# Patient Record
Sex: Male | Born: 1955 | Race: Black or African American | Hispanic: No | Smoking: Former smoker
Health system: Southern US, Community
[De-identification: ages and names within clinical notes are randomized; demographics above are authoritative.]

## PROBLEM LIST (undated history)

## (undated) DIAGNOSIS — I10 Essential (primary) hypertension: Secondary | ICD-10-CM

## (undated) DIAGNOSIS — M1990 Unspecified osteoarthritis, unspecified site: Secondary | ICD-10-CM

## (undated) HISTORY — PX: KNEE ARTHROSCOPY W/ DEBRIDEMENT: SHX1867

---

## 2001-12-23 ENCOUNTER — Emergency Department (HOSPITAL_COMMUNITY): Admission: EM | Admit: 2001-12-23 | Discharge: 2001-12-24 | Payer: Self-pay | Admitting: Emergency Medicine

## 2001-12-24 ENCOUNTER — Encounter: Payer: Self-pay | Admitting: Emergency Medicine

## 2015-10-25 DIAGNOSIS — Z1389 Encounter for screening for other disorder: Secondary | ICD-10-CM | POA: Diagnosis not present

## 2015-10-25 DIAGNOSIS — Z6831 Body mass index (BMI) 31.0-31.9, adult: Secondary | ICD-10-CM | POA: Diagnosis not present

## 2015-10-25 DIAGNOSIS — R7301 Impaired fasting glucose: Secondary | ICD-10-CM | POA: Diagnosis not present

## 2015-10-25 DIAGNOSIS — E6609 Other obesity due to excess calories: Secondary | ICD-10-CM | POA: Diagnosis not present

## 2015-10-25 DIAGNOSIS — R51 Headache: Secondary | ICD-10-CM | POA: Diagnosis not present

## 2016-03-25 ENCOUNTER — Other Ambulatory Visit (HOSPITAL_COMMUNITY): Payer: Self-pay | Admitting: Internal Medicine

## 2016-03-25 DIAGNOSIS — I10 Essential (primary) hypertension: Secondary | ICD-10-CM | POA: Diagnosis not present

## 2016-03-25 DIAGNOSIS — Z6831 Body mass index (BMI) 31.0-31.9, adult: Secondary | ICD-10-CM | POA: Diagnosis not present

## 2016-03-25 DIAGNOSIS — R519 Headache, unspecified: Secondary | ICD-10-CM

## 2016-03-25 DIAGNOSIS — R51 Headache: Principal | ICD-10-CM

## 2016-03-25 DIAGNOSIS — Z1389 Encounter for screening for other disorder: Secondary | ICD-10-CM | POA: Diagnosis not present

## 2016-03-25 DIAGNOSIS — E669 Obesity, unspecified: Secondary | ICD-10-CM | POA: Diagnosis not present

## 2016-03-25 DIAGNOSIS — G43109 Migraine with aura, not intractable, without status migrainosus: Secondary | ICD-10-CM | POA: Diagnosis not present

## 2016-03-26 ENCOUNTER — Ambulatory Visit (HOSPITAL_COMMUNITY)
Admission: RE | Admit: 2016-03-26 | Discharge: 2016-03-26 | Disposition: A | Discharge status: Home / Self Care | Payer: BLUE CROSS/BLUE SHIELD | Source: Ambulatory Visit | Attending: Internal Medicine | Admitting: Internal Medicine

## 2016-03-26 DIAGNOSIS — R51 Headache: Secondary | ICD-10-CM | POA: Insufficient documentation

## 2016-03-26 DIAGNOSIS — R519 Headache, unspecified: Secondary | ICD-10-CM

## 2016-10-14 DIAGNOSIS — Z6830 Body mass index (BMI) 30.0-30.9, adult: Secondary | ICD-10-CM | POA: Diagnosis not present

## 2016-10-14 DIAGNOSIS — Z1389 Encounter for screening for other disorder: Secondary | ICD-10-CM | POA: Diagnosis not present

## 2016-10-14 DIAGNOSIS — Z0000 Encounter for general adult medical examination without abnormal findings: Secondary | ICD-10-CM | POA: Diagnosis not present

## 2016-10-14 DIAGNOSIS — G43109 Migraine with aura, not intractable, without status migrainosus: Secondary | ICD-10-CM | POA: Diagnosis not present

## 2016-10-14 DIAGNOSIS — E6609 Other obesity due to excess calories: Secondary | ICD-10-CM | POA: Diagnosis not present

## 2017-08-04 DIAGNOSIS — M25562 Pain in left knee: Secondary | ICD-10-CM | POA: Diagnosis not present

## 2017-08-04 DIAGNOSIS — E663 Overweight: Secondary | ICD-10-CM | POA: Diagnosis not present

## 2017-08-04 DIAGNOSIS — M6289 Other specified disorders of muscle: Secondary | ICD-10-CM | POA: Diagnosis not present

## 2017-08-04 DIAGNOSIS — Z6829 Body mass index (BMI) 29.0-29.9, adult: Secondary | ICD-10-CM | POA: Diagnosis not present

## 2017-08-04 DIAGNOSIS — Z1389 Encounter for screening for other disorder: Secondary | ICD-10-CM | POA: Diagnosis not present

## 2017-09-10 ENCOUNTER — Ambulatory Visit: Payer: Self-pay | Admitting: Orthopedic Surgery

## 2017-09-10 ENCOUNTER — Encounter: Payer: Self-pay | Admitting: Orthopedic Surgery

## 2017-09-10 NOTE — Progress Notes (Deleted)
  NEW PATIENT OFFICE VISIT   No chief complaint on file.    MEDICAL DECISION SECTION  xrays ordered? ***  My independent reading of xrays: ***   No diagnosis found.   PLAN: ***  No orders of the defined types were placed in this encounter.  Injection? *** MRI/CT/? ***      No chief complaint on file.   HPI  ROS   No past medical history on file.  *** The histories are not reviewed yet. Please review them in the "History" navigator section and refresh this SmartLink.  No family history on file. Social History   Tobacco Use  . Smoking status: Not on file  Substance Use Topics  . Alcohol use: Not on file  . Drug use: Not on file    @  No outpatient medications have been marked as taking for the 09/10/17 encounter (Appointment) with Vickki Hearing, MD.    There were no vitals taken for this visit.  Physical Exam  Ortho Exam    Fuller Canada, MD  09/10/2017 8:26 AM

## 2017-09-20 ENCOUNTER — Ambulatory Visit: Payer: Self-pay | Admitting: Orthopedic Surgery

## 2017-10-01 ENCOUNTER — Ambulatory Visit: Payer: Self-pay | Admitting: Orthopedic Surgery

## 2017-10-01 ENCOUNTER — Encounter: Payer: Self-pay | Admitting: Orthopedic Surgery

## 2017-10-24 IMAGING — CT CT HEAD W/O CM
3 series · 16 of 47 positions shown, 19 images · non-contrast
Comparison: None.

CLINICAL DATA: Vision changes, headache, intermittent for 1 year.

EXAM:
CT HEAD WITHOUT CONTRAST
TECHNIQUE: Contiguous axial images were obtained from the base of the skull
through the vertex without intravenous contrast.

[Series 2: head wo · axial · 0.46mm/px · z∈[+187,+322]mm · 10 of 33 slices shown, 13 images]
[im 3/33  brain]
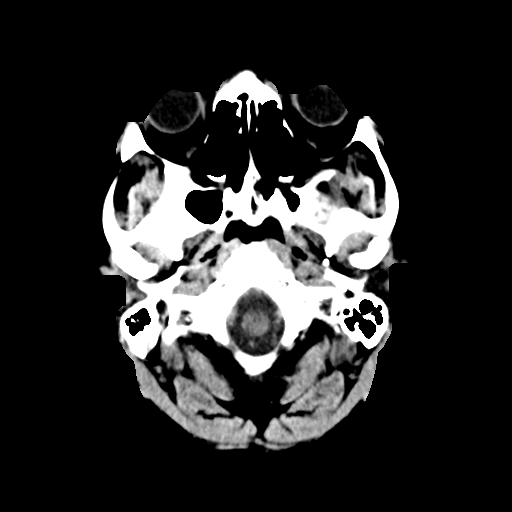
[im 3/33  bone]
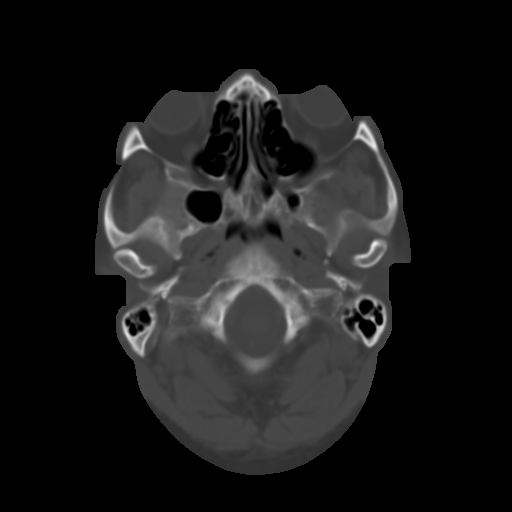
[im 6/33  brain]
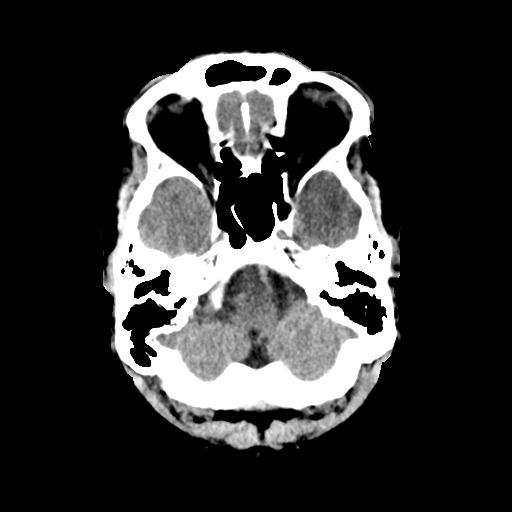
[im 9/33  brain]
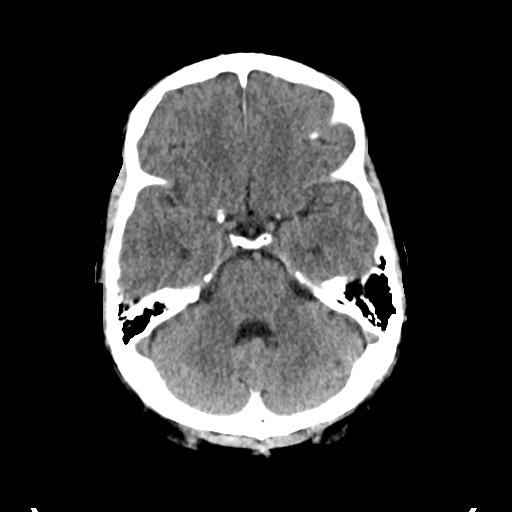
[im 12/33  brain]
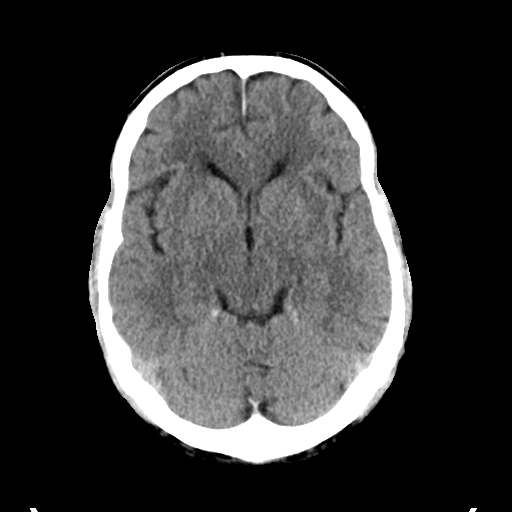
[im 15/33  brain]
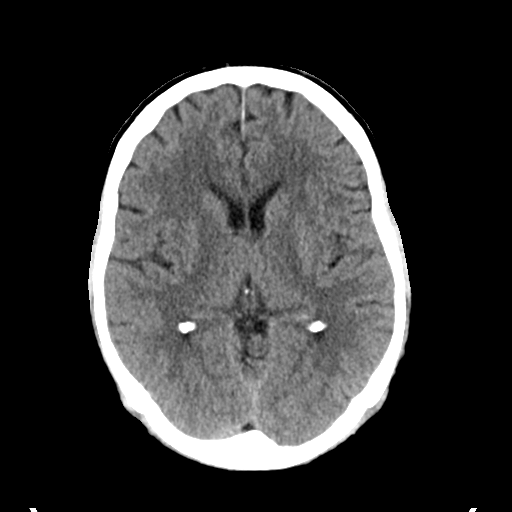
[im 15/33  bone]
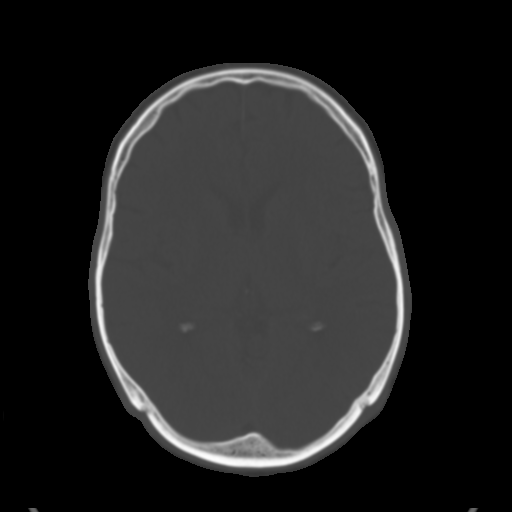
[im 18/33  brain]
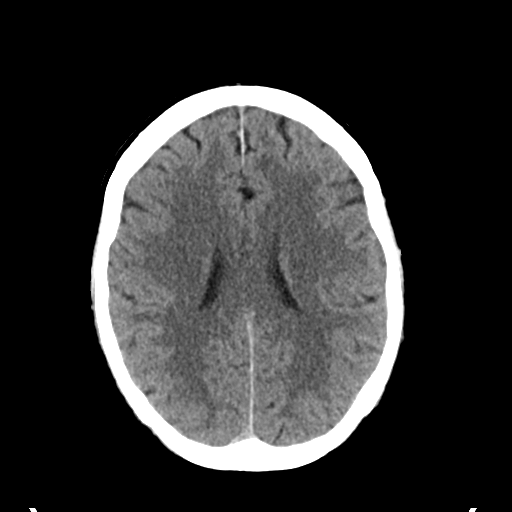
[im 21/33  brain]
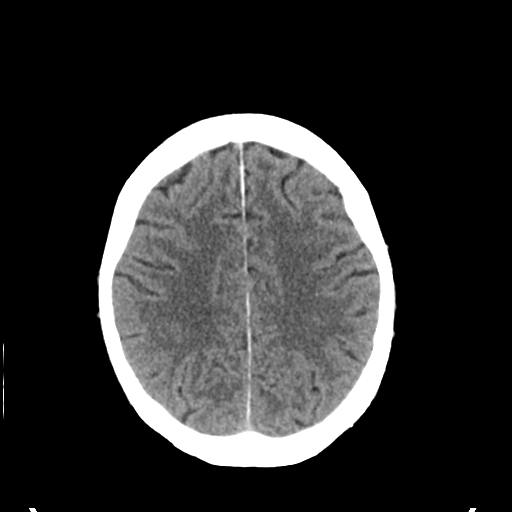
[im 25/33  brain]
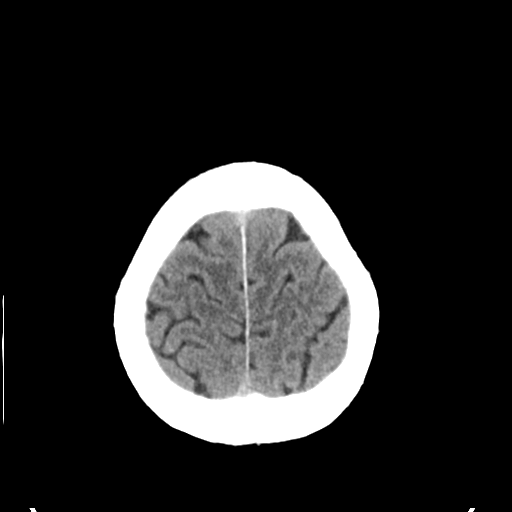
[im 27/33  brain]
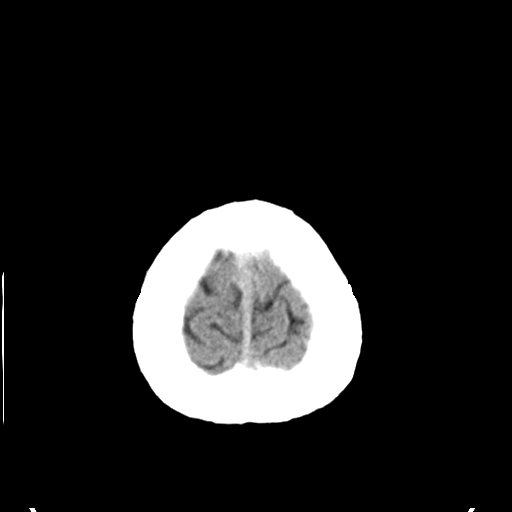
[im 27/33  bone]
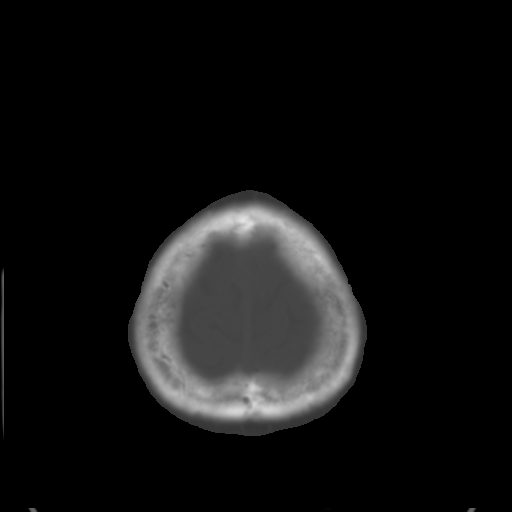
[im 30/33  brain]
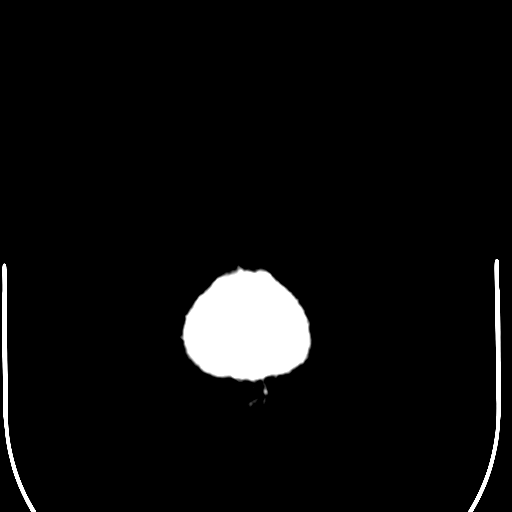

[Series 4: coronal soft tissue · coronal · 0.35mm/px · 3 of 71 slices shown]
[im 24/71  brain]
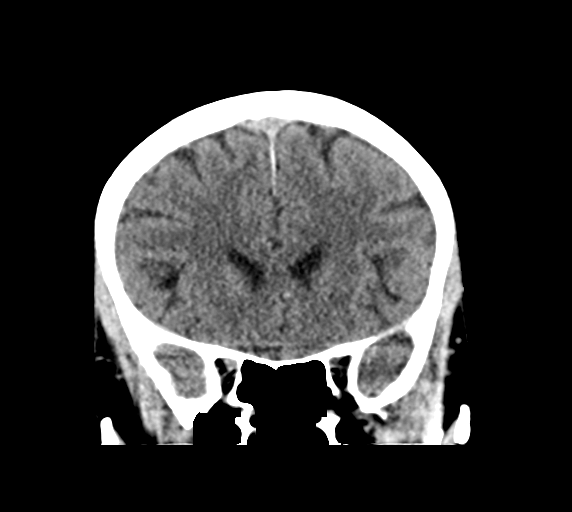
[im 32/71  brain]
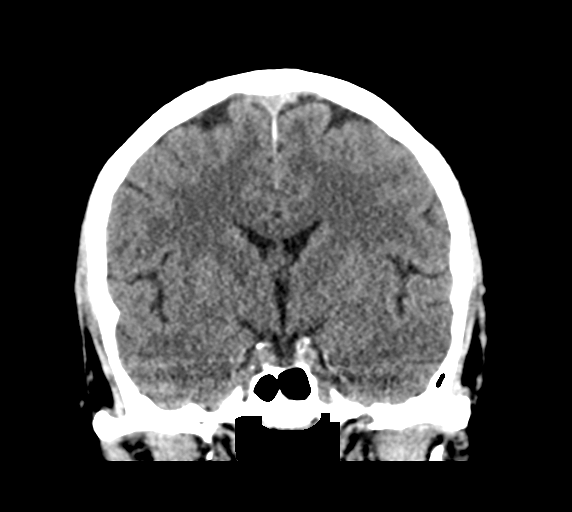
[im 39/71  brain]
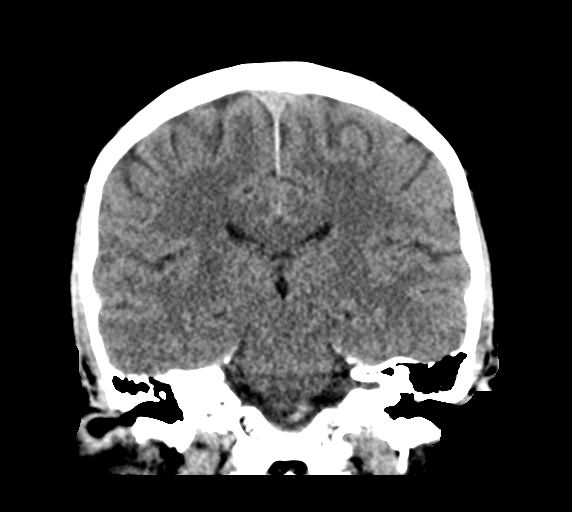

[Series 5: sagittal soft tissue · sagittal · 0.37mm/px · 3 of 67 slices shown]
[im 23/67  brain]
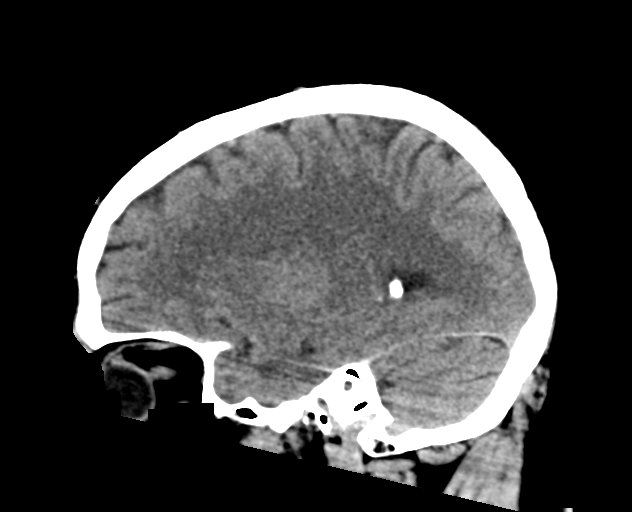
[im 34/67  brain]
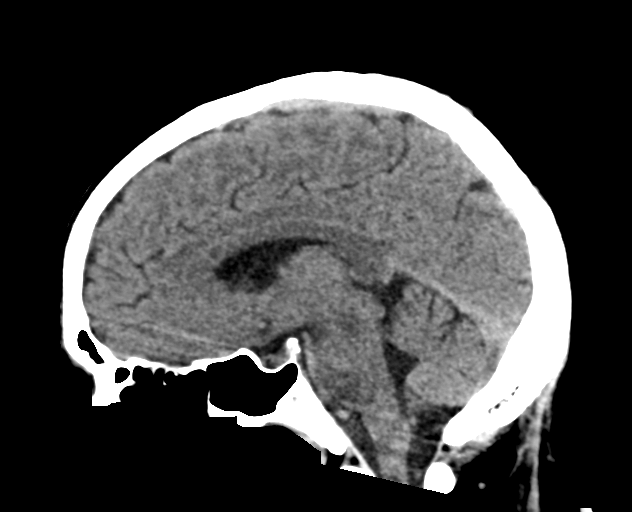
[im 45/67  brain]
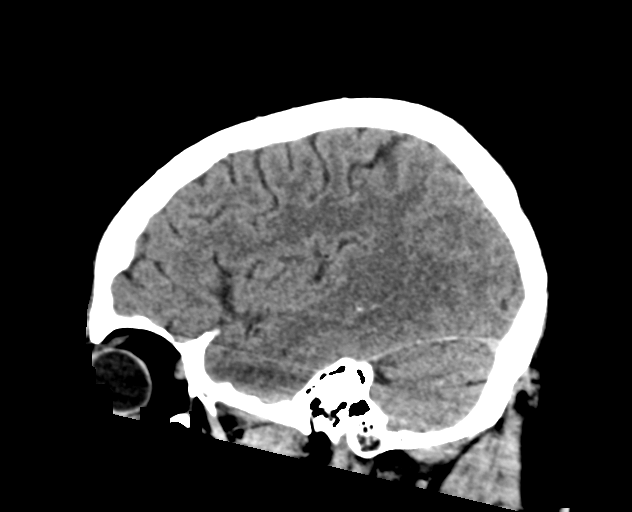

[16 of 47 positions shown; findings below may reference images not displayed]

FINDINGS: Brain: No acute intracranial abnormality. Specifically, no
hemorrhage, hydrocephalus, mass lesion, acute infarction, or
significant intracranial injury.

Vascular: No hyperdense vessel or unexpected calcification.

Skull: No acute calvarial abnormality.

Sinuses/Orbits: Visualized paranasal sinuses and mastoids clear.
Orbital soft tissues unremarkable.

Other: None
IMPRESSION: Normal study.

## 2020-05-20 ENCOUNTER — Ambulatory Visit: Admission: EM | Admit: 2020-05-20 | Discharge: 2020-05-20 | Discharge status: Absent without leave | Payer: Self-pay

## 2020-05-20 ENCOUNTER — Other Ambulatory Visit: Payer: Self-pay

## 2020-05-23 ENCOUNTER — Other Ambulatory Visit: Payer: Self-pay

## 2020-05-23 DIAGNOSIS — Z20822 Contact with and (suspected) exposure to covid-19: Secondary | ICD-10-CM

## 2020-05-24 LAB — NOVEL CORONAVIRUS, NAA: SARS-CoV-2, NAA: NOT DETECTED

## 2020-05-24 LAB — SARS-COV-2, NAA 2 DAY TAT

## 2020-06-10 ENCOUNTER — Encounter: Payer: Self-pay | Admitting: Emergency Medicine

## 2020-06-10 ENCOUNTER — Other Ambulatory Visit: Payer: Self-pay

## 2020-06-10 ENCOUNTER — Ambulatory Visit
Admission: EM | Admit: 2020-06-10 | Discharge: 2020-06-10 | Disposition: A | Discharge status: Home / Self Care | Payer: Self-pay

## 2020-06-10 DIAGNOSIS — J069 Acute upper respiratory infection, unspecified: Secondary | ICD-10-CM

## 2020-06-10 DIAGNOSIS — R519 Headache, unspecified: Secondary | ICD-10-CM

## 2020-06-10 HISTORY — DX: Essential (primary) hypertension: I10

## 2020-06-10 MED ORDER — CETIRIZINE HCL 10 MG PO TABS: 10.0000 mg | ORAL_TABLET | Freq: Every day | ORAL | 0 refills | Status: DC

## 2020-06-10 MED ORDER — BENZONATATE 100 MG PO CAPS: 100.0000 mg | ORAL_CAPSULE | Freq: Three times a day (TID) | ORAL | 0 refills | Status: DC | PRN

## 2020-06-10 MED ORDER — FLUTICASONE PROPIONATE 50 MCG/ACT NA SUSP: 1.0000 | Freq: Every day | NASAL | 0 refills | Status: DC

## 2020-06-10 MED ORDER — PREDNISONE 10 MG PO TABS: 20.0000 mg | ORAL_TABLET | Freq: Every day | ORAL | 0 refills | Status: DC

## 2020-06-10 NOTE — ED Provider Notes (Signed)
[facility]   [id number] 06/10/20 Arrival Time: 1352   CC: URI  SUBJECTIVE: History from: patient.  Tim Grant is a 65 y.o. male who presented to the urgent care for complaint of headache started yesterday, dry cough scratchy throat for the past few weeks.  He also reports left hand a few seconds ago while at the waiting room. He tested negative for Covid 2 weeks ago..  Denies sick exposure to COVID, flu or strep.  Denies recent travel.  Has tried OTC medication without relief.  Denies alleviating aggravating factor.  Denies previous symptoms in the past.   Denies fever, chills, fatigue, sinus pain, rhinorrhea, sore throat, SOB, wheezing, chest pain, nausea, changes in bowel or bladder habits.    ROS: As per HPI.  All other pertinent ROS negative.     Past Medical History:  Diagnosis Date  . Hypertension    History reviewed. No pertinent surgical history. Not on File No current facility-administered medications on file prior to encounter.   Current Outpatient Medications on File Prior to Encounter  Medication Sig Dispense Refill  . losartan (COZAAR) 25 MG tablet Take 25 mg by mouth daily.     Social History   Socioeconomic History  . Marital status: Legally Separated    Spouse name: Not on file  . Number of children: Not on file  . Years of education: Not on file  . Highest education level: Not on file  Occupational History  . Not on file  Tobacco Use  . Smoking status: Former Games developer  . Smokeless tobacco: Never Used  Vaping Use  . Vaping Use: Never used  Substance and Sexual Activity  . Alcohol use: Yes    Comment: every other day  . Drug use: Never  . Sexual activity: Not on file  Other Topics Concern  . Not on file  Social History Narrative  . Not on file   Social Determinants of Health   Financial Resource Strain: Not on file  Food Insecurity: Not on file  Transportation Needs: Not on file  Physical Activity: Not on file  Stress: Not on  file  Social Connections: Not on file  Intimate Partner Violence: Not on file   History reviewed. No pertinent family history.  OBJECTIVE:  Vitals:   06/10/20 1440 06/10/20 1445  BP: (S) (!) 188/115 (!) 169/109  Pulse: 90   Resp: 18   Temp: 98.3 F (36.8 C)   TempSrc: Oral   SpO2: 96%      Physical Exam Vitals and nursing note reviewed.  Constitutional:      General: He is not in acute distress.    Appearance: Normal appearance. He is normal weight. He is not ill-appearing, toxic-appearing or diaphoretic.  HENT:     Right Ear: Ear canal and external ear normal. A middle ear effusion is present. There is no impacted cerumen.     Left Ear: Ear canal and external ear normal. A middle ear effusion is present. There is no impacted cerumen.  Cardiovascular:     Rate and Rhythm: Normal rate and regular rhythm.     Pulses: Normal pulses.     Heart sounds: Normal heart sounds. No murmur heard. No friction rub. No gallop.   Pulmonary:     Effort: Pulmonary effort is normal. No respiratory distress.     Breath sounds: Normal breath sounds. No stridor. No wheezing, rhonchi or rales.  Chest:     Chest wall: No tenderness.  Neurological:  General: No focal deficit present.     Mental Status: He is alert and oriented to person, place, and time.     GCS: GCS eye subscore is 4. GCS verbal subscore is 5. GCS motor subscore is 6.     Cranial Nerves: Cranial nerves are intact.     Sensory: Sensation is intact.     Motor: Motor function is intact.     Coordination: Coordination is intact.     Gait: Gait is intact.      LABS:  No results found for this or any previous visit (from the past 24 hour(s)).   ASSESSMENT & PLAN:  1. Acute nonintractable headache, unspecified headache type   2. Viral URI with cough     Meds ordered this encounter  Medications  . fluticasone (FLONASE) 50 MCG/ACT nasal spray    Sig: Place 1 spray into both nostrils daily for 14 days.    Dispense:   16 g    Refill:  0  . cetirizine (ZYRTEC ALLERGY) 10 MG tablet    Sig: Take 1 tablet (10 mg total) by mouth daily.    Dispense:  30 tablet    Refill:  0  . predniSONE (DELTASONE) 10 MG tablet    Sig: Take 2 tablets (20 mg total) by mouth daily.    Dispense:  15 tablet    Refill:  0  . benzonatate (TESSALON) 100 MG capsule    Sig: Take 1 capsule (100 mg total) by mouth 3 (three) times daily as needed for cough.    Dispense:  30 capsule    Refill:  0   Patient is stable at discharge.  Neuro exam is otherwise normal.  Will prescribe Tessalon Perles, Zyrtec, Flonase and prednisone to help with his symptoms.  He was advised to go to ER if you develop any neurological symptoms.   Discharge instructions  Get plenty of rest and push fluids Tessalon Perles  for cough Zyrtec was prescribed for congestion  flonase for nasal congestion and runny nose Prednisone was prescribed Use medications daily for symptom relief Use OTC medications like ibuprofen or tylenol as needed fever or pain Call or go to the ED if you have any new or worsening symptoms such as fever, worsening cough, shortness of breath, chest tightness, chest pain, turning blue, changes in mental status, confusion, blurry vision, facial droop, tingling and numbness, worsening headache your life etc..  Reviewed expectations re: course of current medical issues. Questions answered. Outlined signs and symptoms indicating need for more acute intervention. Patient verbalized understanding. After Visit Summary given.         Durward Parcel, FNP 06/10/20 1530

## 2020-06-10 NOTE — ED Triage Notes (Signed)
Headache since yesterday, dry cough, scratchy throat.  States while he was waiting, his left hand went numb.

## 2020-06-10 NOTE — ED Triage Notes (Signed)
States he can't focus with his vision right now.

## 2020-06-10 NOTE — Discharge Instructions (Addendum)
Get plenty of rest and push fluids Tessalon Perles  for cough Zyrtec was prescribed for congestion  flonase for nasal congestion and runny nose Prednisone was prescribed Use medications daily for symptom relief Use OTC medications like ibuprofen or tylenol as needed fever or pain Call or go to the ED if you have any new or worsening symptoms such as fever, worsening cough, shortness of breath, chest tightness, chest pain, turning blue, changes in mental status, confusion, blurry vision, facial droop, tingling and numbness, worsening headache your life etc..

## 2021-05-13 ENCOUNTER — Ambulatory Visit (INDEPENDENT_AMBULATORY_CARE_PROVIDER_SITE_OTHER): Payer: 59 | Admitting: Orthopaedic Surgery

## 2021-05-13 ENCOUNTER — Encounter: Payer: Self-pay | Admitting: Orthopaedic Surgery

## 2021-05-13 ENCOUNTER — Ambulatory Visit: Payer: 59

## 2021-05-13 ENCOUNTER — Other Ambulatory Visit: Payer: Self-pay

## 2021-05-13 VITALS — BP 161/105 | HR 90 | Ht 69.0 in | Wt 209.8 lb

## 2021-05-13 DIAGNOSIS — M25552 Pain in left hip: Secondary | ICD-10-CM

## 2021-05-13 DIAGNOSIS — M25562 Pain in left knee: Secondary | ICD-10-CM | POA: Diagnosis not present

## 2021-05-13 DIAGNOSIS — G8929 Other chronic pain: Secondary | ICD-10-CM

## 2021-05-13 MED ORDER — HYDROCODONE-ACETAMINOPHEN 5-325 MG PO TABS
ORAL_TABLET | ORAL | 0 refills | Status: DC
Start: 2021-05-13 — End: 2021-09-10

## 2021-05-13 NOTE — Progress Notes (Signed)
Subjective:    Patient ID: Tim Grant, male    DOB: Apr 27, 1955, 66 y.o.   MRN: [id number]  HPI He has had knee pain on the left for many years, comes and goes.  He had arthroscopy of the left knee in [place] at the old [facility] about 18 to 20 years ago.  He has swelling and popping of the left knee.  He has no giving way.  He has no trauma.  He cannot put his shoes on the left foot as he cannot move his knee and hip well enough to do it.  He has to have shoes that do not need tieing.  He has no trauma to the hip.  He has tried Tylenol, rubs, heat, ice with no help.  He has limp to the left and it bothers his back some.   Review of Systems  Constitutional:  Positive for activity change.  Musculoskeletal:  Positive for arthralgias, gait problem, joint swelling and myalgias.  All other systems reviewed and are negative. For Review of Systems, all other systems reviewed and are negative.  The following is a summary of the past history medically, past history surgically, known current medicines, social history and family history.  This information is gathered electronically by the computer from prior information and documentation.  I review this each visit and have found including this information at this point in the chart is beneficial and informative.   Past Medical History:  Diagnosis Date   Hypertension     No past surgical history on file.  Current Outpatient Medications on File Prior to Visit  Medication Sig Dispense Refill   benzonatate (TESSALON) 100 MG capsule Take 1 capsule (100 mg total) by mouth 3 (three) times daily as needed for cough. 30 capsule 0   cetirizine (ZYRTEC ALLERGY) 10 MG tablet Take 1 tablet (10 mg total) by mouth daily. 30 tablet 0   losartan (COZAAR) 25 MG tablet Take 25 mg by mouth daily.     No current facility-administered medications on file prior to visit.    Social History   Socioeconomic History   Marital status: Legally Separated     Spouse name: Not on file   Number of children: Not on file   Years of education: Not on file   Highest education level: Not on file  Occupational History   Not on file  Tobacco Use   Smoking status: Former   Smokeless tobacco: Never  Vaping Use   Vaping Use: Never used  Substance and Sexual Activity   Alcohol use: Yes    Comment: every other day   Drug use: Never   Sexual activity: Not on file  Other Topics Concern   Not on file  Social History Narrative   Not on file   Social Determinants of Health   Financial Resource Strain: Not on file  Food Insecurity: Not on file  Transportation Needs: Not on file  Physical Activity: Not on file  Stress: Not on file  Social Connections: Not on file  Intimate Partner Violence: Not on file    No family history on file.  BP (!) 161/105    Pulse 90    Ht 5\' 9"  (1.753 m)    Wt 209 lb 12.8 oz (95.2 kg)    BMI 30.98 kg/m   Body mass index is 30.98 kg/m.     Objective:   Physical Exam Vitals and nursing note reviewed. Exam conducted with a chaperone present.  Constitutional:  Appearance: He is well-developed.  HENT:     Head: Normocephalic and atraumatic.  Eyes:     Conjunctiva/sclera: Conjunctivae normal.     Pupils: Pupils are equal, round, and reactive to light.  Cardiovascular:     Rate and Rhythm: Normal rate and regular rhythm.  Pulmonary:     Effort: Pulmonary effort is normal.  Abdominal:     Palpations: Abdomen is soft.  Musculoskeletal:     Cervical back: Normal range of motion and neck supple.       Legs:  Skin:    General: Skin is warm and dry.  Neurological:     Mental Status: He is alert and oriented to person, place, and time.     Cranial Nerves: No cranial nerve deficit.     Motor: No abnormal muscle tone.     Coordination: Coordination normal.     Deep Tendon Reflexes: Reflexes are normal and symmetric. Reflexes normal.  Psychiatric:        Behavior: Behavior normal.        Thought Content:  Thought content normal.        Judgment: Judgment normal.   X-rays were done of the left hip and left knee, reported separately.       Assessment & Plan:   Encounter Diagnoses  Name Primary?   Chronic left hip pain Yes   Chronic pain of left knee    I am concerned about avascular necrosis of the left hip.  I will get MRI.  I will call in pain medicine.  Take one Aleve bid pc  I have reviewed the [place] Controlled Substance Reporting System web site prior to prescribing narcotic medicine for this patient.  Return in two weeks.  Call if any problem.  Precautions discussed.  Electronically Signed Darreld Mclean, MD 1/24/20239:10 AM

## 2021-05-20 ENCOUNTER — Telehealth: Payer: Self-pay | Admitting: Orthopaedic Surgery

## 2021-05-20 NOTE — Telephone Encounter (Signed)
Patient called and wants to know when his MRI is no one has called him back with a date and time.  I gave him the number to the scheduling.  He said no we are suppose to call and schedule it and call him back.   Please call him back at [phone]   We might have to change his appt with Dr. Hilda Lias to review his results.   Thanks

## 2021-05-23 ENCOUNTER — Other Ambulatory Visit: Payer: Self-pay

## 2021-05-23 ENCOUNTER — Encounter: Payer: Self-pay | Admitting: Podiatry

## 2021-05-23 ENCOUNTER — Ambulatory Visit (INDEPENDENT_AMBULATORY_CARE_PROVIDER_SITE_OTHER): Payer: 59 | Admitting: Podiatry

## 2021-05-23 DIAGNOSIS — I739 Peripheral vascular disease, unspecified: Secondary | ICD-10-CM | POA: Diagnosis not present

## 2021-05-23 DIAGNOSIS — B351 Tinea unguium: Secondary | ICD-10-CM | POA: Diagnosis not present

## 2021-05-23 DIAGNOSIS — M79674 Pain in right toe(s): Secondary | ICD-10-CM | POA: Diagnosis not present

## 2021-05-23 DIAGNOSIS — M79675 Pain in left toe(s): Secondary | ICD-10-CM

## 2021-05-23 NOTE — Patient Instructions (Signed)
Terbinafine Tablets °What is this medication? °TERBINAFINE (TER bin a feen) treats fungal infections of the nails. It belongs to a group of medications called antifungals. It will not treat infections caused by bacteria or viruses. °This medicine may be used for other purposes; ask your health care provider or pharmacist if you have questions. °COMMON BRAND NAME(S): Lamisil, Terbinex °What should I tell my care team before I take this medication? °They need to know if you have any of these conditions: °Liver disease °An unusual or allergic reaction to terbinafine, other medications, foods, dyes, or preservatives °Pregnant or trying to get pregnant °Breast-feeding °How should I use this medication? °Take this medication by mouth with water. Take it as directed on the prescription label at the same time every day. You can take it with or without food. If it upsets your stomach, take it with food. Keep taking it unless your care team tells you to stop. °A special MedGuide will be given to you by the pharmacist with each prescription and refill. Be sure to read this information carefully each time. °Talk to your care team regarding the use of this medication in children. Special care may be needed. °Overdosage: If you think you have taken too much of this medicine contact a poison control center or emergency room at once. °NOTE: This medicine is only for you. Do not share this medicine with others. °What if I miss a dose? °If you miss a dose, take it as soon as you can unless it is more than 4 hours late. If it is more than 4 hours late, skip the missed dose. Take the next dose at the normal time. °What may interact with this medication? °Do not take this medication with any of the following: °Pimozide °Thioridazine °This medication may also interact with the following: °Beta blockers °Caffeine °Certain medications for mental health conditions °Cimetidine °Cyclosporine °Medications for fungal infections like fluconazole  and ketoconazole °Medications for irregular heartbeat like amiodarone, flecainide and propafenone °Rifampin °Warfarin °This list may not describe all possible interactions. Give your health care provider a list of all the medicines, herbs, non-prescription drugs, or dietary supplements you use. Also tell them if you smoke, drink alcohol, or use illegal drugs. Some items may interact with your medicine. °What should I watch for while using this medication? °Visit your care team for regular checks on your progress. You may need blood work while you are taking this medication. It may be some time before you see the benefit from this medication. °This medication may cause serious skin reactions. They can happen weeks to months after starting the medication. Contact your care team right away if you notice fevers or flu-like symptoms with a rash. The rash may be red or purple and then turn into blisters or peeling of the skin. Or, you might notice a red rash with swelling of the face, lips or lymph nodes in your neck or under your arms. °This medication can make you more sensitive to the sun. Keep out of the sun, If you cannot avoid being in the sun, wear protective clothing and sunscreen. Do not use sun lamps or tanning beds/booths. °What side effects may I notice from receiving this medication? °Side effects that you should report to your care team as soon as possible: °Allergic reactions--skin rash, itching, hives, swelling of the face, lips, tongue, or throat °Change in sense of smell °Change in taste °Infection--fever, chills, cough, or sore throat °Liver injury--right upper belly pain, loss of appetite, nausea,   light-colored stool, dark yellow or brown urine, yellowing skin or eyes, unusual weakness or fatigue °Low red blood cell level--unusual weakness or fatigue, dizziness, headache, trouble breathing °Lupus-like syndrome--joint pain, swelling, or stiffness, butterfly-shaped rash on the face, rashes that get worse  in the sun, fever, unusual weakness or fatigue °Rash, fever, and swollen lymph nodes °Redness, blistering, peeling, or loosening of the skin, including inside the mouth °Unusual bruising or bleeding °Worsening mood, feelings of depression °Side effects that usually do not require medical attention (report to your care team if they continue or are bothersome): °Diarrhea °Gas °Headache °Nausea °Stomach pain °Upset stomach °This list may not describe all possible side effects. Call your doctor for medical advice about side effects. You may report side effects to FDA at 1-800-FDA-1088. °Where should I keep my medication? °Keep out of the reach of children and pets. °Store between 20 and 25 degrees C (68 and 77 degrees F). Protect from light. Get rid of any unused medication after the expiration date. °To get rid of medications that are no longer needed or have expired: °Take the medication to a medication take-back program. Check with your pharmacy or law enforcement to find a location. °If you cannot return the medication, check the label or package insert to see if the medication should be thrown out in the garbage or flushed down the toilet. If you are not sure, ask your care team. If it is safe to put it in the trash, take the medication out of the container. Mix the medication with cat litter, dirt, coffee grounds, or other unwanted substance. Seal the mixture in a bag or container. Put it in the trash. °NOTE: This sheet is a summary. It may not cover all possible information. If you have questions about this medicine, talk to your doctor, pharmacist, or health care provider. °© 2022 [employer]vier/Gold Standard (2020-11-20 00:00:00) ° °

## 2021-05-27 ENCOUNTER — Ambulatory Visit: Payer: 59 | Admitting: Orthopaedic Surgery

## 2021-05-27 NOTE — Progress Notes (Signed)
Subjective:   Patient ID: Tim Grant, male   DOB: 66 y.o.   MRN: [id number]   HPI 66 year old male presents the office today for concerns of thick, elongated toenails fungus.  He states this for about 2 years ago.  He has tried over-the-counter spray as well as topical Lamisil without any improvement.  Denies any swelling or redness to the toenail sites.  Recently was started on terbinafine which she has not yet picked up the prescription.  He is actually from the pharmacy today working on insurance make sure it is covered, which it is.   Review of Systems  All other systems reviewed and are negative.  Past Medical History:  Diagnosis Date   Hypertension     No past surgical history on file.   Current Outpatient Medications:    benzonatate (TESSALON) 100 MG capsule, Take 1 capsule (100 mg total) by mouth 3 (three) times daily as needed for cough., Disp: 30 capsule, Rfl: 0   cetirizine (ZYRTEC ALLERGY) 10 MG tablet, Take 1 tablet (10 mg total) by mouth daily., Disp: 30 tablet, Rfl: 0   HYDROcodone-acetaminophen (NORCO/VICODIN) 5-325 MG tablet, One tablet every four hours as needed for acute pain.  Limit of five days per [place] statue., Disp: 30 tablet, Rfl: 0   losartan (COZAAR) 25 MG tablet, Take 25 mg by mouth daily., Disp: , Rfl:   Not on File        Objective:  Physical Exam  General: AAO x3, NAD  Dermatological: Nails are significantly hypertrophic, dystrophic with yellow-brown discoloration.  Dark discoloration of multiple toenails.  There is no extension of any hyperpigmentation of the surrounding skin appears to be benign.  He does get discomfort nails 1-5 bilaterally as they are thick and elongated they are not static shoes.  No changes on multiple toenails at this time.  Open lesions.  Vascular: Dorsalis Pedis artery and Posterior Tibial artery pedal pulses are decreased bilateral with immedate capillary fill time.  There is no pain with calf compression,  swelling, warmth, erythema.   Neruologic: Grossly intact via light touch bilateral.  Musculoskeletal: No gross boney pedal deformities bilateral. No pain, crepitus, or limitation noted with foot and ankle range of motion bilateral. Muscular strength 5/5 in all groups tested bilateral.  Gait: Unassisted, Nonantalgic.       Assessment:   Symptomatic onychomycosis, decreased pedal pulses     Plan:  -Treatment options discussed including all alternatives, risks, and complications -Etiology of symptoms were discussed -Sharply debrided the nails x10 without any complications or bleeding.  Discussed treatment options for nail fungus.  He is undergoing to start Lamisil which was prescribed by his primary care provider.  Discussed side effects of medication. -Given decreased pulses and will order ABI.  Return in about 3 months (around 08/20/2021).  Trula Slade DPM

## 2021-05-28 ENCOUNTER — Telehealth: Payer: Self-pay | Admitting: *Deleted

## 2021-06-01 ENCOUNTER — Other Ambulatory Visit: Payer: 59

## 2021-06-05 ENCOUNTER — Ambulatory Visit: Payer: 59 | Admitting: Orthopaedic Surgery

## 2021-06-06 ENCOUNTER — Other Ambulatory Visit: Payer: Self-pay | Admitting: Podiatry

## 2021-06-06 DIAGNOSIS — I739 Peripheral vascular disease, unspecified: Secondary | ICD-10-CM

## 2021-06-06 NOTE — Progress Notes (Signed)
Received notification that the ABI ordered was out of network. I have sent updated referral to [facility] to see if they accept.

## 2021-06-13 ENCOUNTER — Ambulatory Visit (HOSPITAL_COMMUNITY)
Admission: RE | Admit: 2021-06-13 | Discharge: 2021-06-13 | Disposition: A | Discharge status: Home / Self Care | Payer: 59 | Source: Ambulatory Visit | Attending: Orthopaedic Surgery | Admitting: Orthopaedic Surgery

## 2021-06-13 ENCOUNTER — Other Ambulatory Visit: Payer: Self-pay

## 2021-06-13 DIAGNOSIS — G8929 Other chronic pain: Secondary | ICD-10-CM | POA: Insufficient documentation

## 2021-06-13 DIAGNOSIS — M25552 Pain in left hip: Secondary | ICD-10-CM | POA: Diagnosis present

## 2021-06-25 ENCOUNTER — Other Ambulatory Visit (HOSPITAL_COMMUNITY): Payer: Self-pay | Admitting: Podiatry

## 2021-06-25 DIAGNOSIS — I739 Peripheral vascular disease, unspecified: Secondary | ICD-10-CM

## 2021-06-26 ENCOUNTER — Ambulatory Visit (INDEPENDENT_AMBULATORY_CARE_PROVIDER_SITE_OTHER): Payer: 59 | Admitting: Orthopaedic Surgery

## 2021-06-26 ENCOUNTER — Encounter: Payer: Self-pay | Admitting: Orthopaedic Surgery

## 2021-06-26 ENCOUNTER — Other Ambulatory Visit: Payer: Self-pay

## 2021-06-26 DIAGNOSIS — G8929 Other chronic pain: Secondary | ICD-10-CM | POA: Diagnosis not present

## 2021-06-26 DIAGNOSIS — M25552 Pain in left hip: Secondary | ICD-10-CM

## 2021-06-26 NOTE — Progress Notes (Signed)
My hip still hurts. ? ?He had MRI of the left hip.  It showed: ?IMPRESSION: ?1. Severe osteoarthritis of the left hip. ?2. Mild osteoarthritis of the right hip. ?3. 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 ?  ?I have explained the findings to him.  I have recommended he consider total hip on the left.  I will have him see Dr. Ninfa Linden.  He is agreeable to this. ? ?I have independently reviewed the MRI.   ? ?Left hip has marked decreased ROM.  He has slight limp to the left. ?NV intact.  He has no distal edema. ? ?Encounter Diagnosis  ?Name Primary?  ? Chronic left hip pain Yes  ? ?To see Dr. Ninfa Linden. ? ?Call if any problem. ? ?Precautions discussed. ? ?Electronically Signed ?Sanjuana Kava, MD ?3/9/20238:07 AM ? ?

## 2021-06-30 ENCOUNTER — Ambulatory Visit (HOSPITAL_COMMUNITY): Admission: RE | Admit: 2021-06-30 | Payer: 59 | Source: Ambulatory Visit

## 2021-07-01 ENCOUNTER — Ambulatory Visit (HOSPITAL_COMMUNITY)
Admission: RE | Admit: 2021-07-01 | Discharge: 2021-07-01 | Disposition: A | Discharge status: Home / Self Care | Payer: 59 | Source: Ambulatory Visit | Attending: Cardiology | Admitting: Cardiology

## 2021-07-01 ENCOUNTER — Other Ambulatory Visit: Payer: Self-pay

## 2021-07-01 DIAGNOSIS — I739 Peripheral vascular disease, unspecified: Secondary | ICD-10-CM | POA: Insufficient documentation

## 2021-07-11 NOTE — Telephone Encounter (Signed)
error 

## 2021-07-15 ENCOUNTER — Telehealth: Payer: Self-pay | Admitting: Orthopaedic Surgery

## 2021-07-15 ENCOUNTER — Other Ambulatory Visit: Payer: Self-pay

## 2021-07-15 ENCOUNTER — Other Ambulatory Visit: Payer: Self-pay | Admitting: Orthopaedic Surgery

## 2021-07-15 ENCOUNTER — Ambulatory Visit (INDEPENDENT_AMBULATORY_CARE_PROVIDER_SITE_OTHER): Payer: 59 | Admitting: Orthopaedic Surgery

## 2021-07-15 ENCOUNTER — Encounter: Payer: Self-pay | Admitting: Orthopaedic Surgery

## 2021-07-15 VITALS — Ht 69.0 in | Wt 209.8 lb

## 2021-07-15 DIAGNOSIS — M1612 Unilateral primary osteoarthritis, left hip: Secondary | ICD-10-CM | POA: Diagnosis not present

## 2021-07-15 MED ORDER — TRAMADOL HCL 50 MG PO TABS
50.0000 mg | ORAL_TABLET | Freq: Four times a day (QID) | ORAL | 0 refills | Status: DC | PRN
Start: 2021-07-15 — End: 2021-09-16

## 2021-07-15 NOTE — Telephone Encounter (Signed)
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 ?

## 2021-07-15 NOTE — Progress Notes (Addendum)
? ?Office Visit Note ?  ?Patient: Tim Grant           ?Date of Birth: 12-Aug-1955           ?MRN: [id number] ?Visit Date: 07/15/2021 ?             ?Requested by: Tim Mclean, MD ?[phone] ?[place],  [place] [address] ?PCP: Tim Nevins, MD ? ? ?Assessment & Plan: ?Visit Diagnoses:  ?1. Primary osteoarthritis of left hip   ? ? ?Plan: Impression end-stage arthritis left hip.  Given the fact that the left hip pain is affecting his quality of life recommend left total hip arthroplasty.  He has tried activity modification including refraining from bowling which she enjoys.  He is now wearing shoes that require no tying.  Due to the fact that the has difficulty moving the hip such that he can not tie his shoes.  He has tried Tylenol, heat, ice and different rubs without any relief.  Again MRI shows full thickness cartilage.  Patient would like to proceed with left total hip arthroplasty in the near future.  Risk benefits of surgery discussed with the patient by Dr. Magnus Grant and myself.  Handout on left hip surgery was given.  Risk include but are not limited to leg length discrepancy, infection, nerve vessel injury, blood loss, prolonged pain and worsening pain.  Follow-up 2 weeks postop.  Questions were encouraged and answered at length.  ? ?Follow-Up Instructions: Return for post op.  ? ?Orders:  ?No orders of the defined types were placed in this encounter. ? ?No orders of the defined types were placed in this encounter. ? ? ? ? Procedures: ?No procedures performed ? ? ?Clinical Data: ?No additional findings. ? ? ?Subjective: ?Chief Complaint  ?Patient presents with  ? Left Hip - Pain  ? ? ?HPI ?Tim Grant 66 year old male seen for left hip pain per Dr. Hilda Grant.  Patient states he has had ongoing left hip pain for years.  Radiates down to his knee.  Describes the pain is 10 out of 10 pain in the groin area which is burning in nature.  He states he cannot drive for long time due to pain in the hip.   He has to get out and stretch his legs.  He has problems donning shoes and socks due to the left hip pain.  He notes he is unable to cross his left leg.  He uses no assistive device to ambulate.  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  He states that he is unable to do enjoy activities such as bowling due to the hip pain. ?Radiographs AP pelvis lateral view of the left hip shows end-stage arthritis of the left hip with periarticular spurring.  Cystic formations are seen within the femoral head. ?MRI of the left hip is reviewed.  MRI dated to 05/24/21.  This shows no evidence of AVN.  Full-thickness cartilage loss involving the left femoral head.  Subchondral cystic changes are noted and periarticular spurring.  No acute fractures. ? ? ?Review of Systems  ?Constitutional:  Negative for chills and fever.  ?Respiratory:  Negative for shortness of breath.   ?Cardiovascular:  Negative for chest pain.  ?Musculoskeletal:  Positive for arthralgias and gait problem.  ? ? ?Objective: ?Vital Signs: Ht 5\' 9"  (1.753 m)   Wt 209 lb 12.8 oz (95.2 kg)   BMI 30.98 kg/m?  ? ?Physical Exam ?Constitutional:   ?   Appearance: He is not ill-appearing or  diaphoretic.  ?Pulmonary:  ?   Effort: Pulmonary effort is normal.  ?Neurological:  ?   Mental Status: He is alert and oriented to person, place, and time.  ?Psychiatric:     ?   Mood and Affect: Mood normal.  ? ? ?Ortho Exam ?Bilateral hips right hip good range of motion without pain.  Left hip he has very limited external rotation and virtually no internal rotation.  Painful attempts of internal rotation left hip.  Bilateral calf supple nontender.  Dorsiflexion plantarflexion bilateral ankles intact.  Ambulates without any assistive device with slight antalgic gait. ?Specialty Comments:  ?No specialty comments available. ? ?Imaging: ?No results found. ? ? ?PMFS History: ?There are no problems to display for this patient. ? ?Past Medical History:  ?Diagnosis Date  ?  Hypertension   ?  ?No family history on file.  ?No past surgical history on file. ?Social History  ? ?Occupational History  ? Not on file  ?Tobacco Use  ? Smoking status: Former  ? Smokeless tobacco: Never  ?Vaping Use  ? Vaping Use: Never used  ?Substance and Sexual Activity  ? Alcohol use: Yes  ?  Comment: every other day  ? Drug use: Never  ? Sexual activity: Not on file  ? ? ? ? ? ? ?

## 2021-07-16 ENCOUNTER — Other Ambulatory Visit: Payer: Self-pay | Admitting: *Deleted

## 2021-07-21 ENCOUNTER — Other Ambulatory Visit: Payer: Self-pay | Admitting: Orthopaedic Surgery

## 2021-07-22 ENCOUNTER — Other Ambulatory Visit: Payer: Self-pay | Admitting: Orthopaedic Surgery

## 2021-08-14 ENCOUNTER — Other Ambulatory Visit: Payer: Self-pay

## 2021-08-21 ENCOUNTER — Other Ambulatory Visit: Payer: Self-pay

## 2021-08-21 ENCOUNTER — Ambulatory Visit: Payer: 59 | Admitting: Podiatry

## 2021-09-09 ENCOUNTER — Other Ambulatory Visit: Payer: Self-pay | Admitting: Physician Assistant

## 2021-09-09 DIAGNOSIS — M1612 Unilateral primary osteoarthritis, left hip: Secondary | ICD-10-CM

## 2021-09-09 NOTE — Progress Notes (Signed)
Surgical Instructions    Your procedure is scheduled on Tuesday May 30th.  Report to [facility] Main Entrance "A" at 7:40 A.M., then check in with the Admitting office.  Call this number if you have problems the morning of surgery:  [phone]   If you have any questions prior to your surgery date call [phone]: Open Monday-Friday 8am-4pm    Remember:  Do not eat after midnight the night before your surgery  You may drink clear liquids until 6:40 am the morning of your surgery.   Clear liquids allowed are: Water, Non-Citrus Juices (without pulp), Carbonated Beverages, Clear Tea, Black Coffee ONLY (NO MILK, CREAM OR POWDERED CREAMER of any kind), and Gatorade    Take these medicines the morning of surgery with A SIP OF WATER: cetirizine (ZYRTEC ALLERGY) 10 MG tablet   IF NEEDED benzonatate (TESSALON) 100 MG capsule HYDROcodone-acetaminophen (NORCO/VICODIN) 5-325 MG tablet traMADol (ULTRAM) 50 MG tablet   As of today, STOP taking any Aspirin (unless otherwise instructed by your surgeon) Aleve, Naproxen, Ibuprofen, Motrin, Advil, Goody's, BC's, all herbal medications, fish oil, and all vitamins.           Do not wear jewelry  Do not wear lotions, powders, colognes, or deodorant. Do not shave 48 hours prior to surgery.  Men may shave face and neck. Do not bring valuables to the hospital. Do not wear nail polish  [facility] is not responsible for any belongings or valuables. .   Do NOT Smoke (Tobacco/Vaping)  24 hours prior to your procedure  If you use a CPAP at night, you may bring your mask for your overnight stay.   Contacts, glasses, hearing aids, dentures or partials may not be worn into surgery, please bring cases for these belongings   For patients admitted to the hospital, discharge time will be determined by your treatment team.   Patients discharged the day of surgery will not be allowed to drive home, and someone needs to stay with them for 24  hours.   SURGICAL WAITING ROOM VISITATION Patients having surgery or a procedure in a hospital may have two support people. Children under the age of 38 must have an adult with them who is not the patient. They may stay in the waiting area during the procedure and may switch out with other visitors. If the patient needs to stay at the hospital during part of their recovery, the visitor guidelines for inpatient rooms apply.  Please refer to the [facility] website for the visitor guidelines for Inpatients (after your surgery is over and you are in a regular room).       Special instructions:    Oral Hygiene is also important to reduce your risk of infection.  Remember - BRUSH YOUR TEETH THE MORNING OF SURGERY WITH YOUR REGULAR TOOTHPASTE   [facility]- Preparing For Surgery  Before surgery, you can play an important role. Because skin is not sterile, your skin needs to be as free of germs as possible. You can reduce the number of germs on your skin by washing with CHG (chlorahexidine gluconate) Soap before surgery.  CHG is an antiseptic cleaner which kills germs and bonds with the skin to continue killing germs even after washing.     Please do not use if you have an allergy to CHG or antibacterial soaps. If your skin becomes reddened/irritated stop using the CHG.  Do not shave (including legs and underarms) for at least 48 hours prior to first CHG shower. It  is OK to shave your face.  Please follow these instructions carefully.     Shower the NIGHT BEFORE SURGERY and the MORNING OF SURGERY with CHG Soap.   If you chose to wash your hair, wash your hair first as usual with your normal shampoo. After you shampoo, rinse your hair and body thoroughly to remove the shampoo.  Then [employer] and genitals (private parts) with your normal soap and rinse thoroughly to remove soap.  After that Use CHG Soap as you would any other liquid soap. You can apply CHG directly to the skin and wash gently  with a scrungie or a clean washcloth.   Apply the CHG Soap to your body ONLY FROM THE NECK DOWN.  Do not use on open wounds or open sores. Avoid contact with your eyes, ears, mouth and genitals (private parts). Wash Face and genitals (private parts)  with your normal soap.   Wash thoroughly, paying special attention to the area where your surgery will be performed.  Thoroughly rinse your body with warm water from the neck down.  DO NOT shower/wash with your normal soap after using and rinsing off the CHG Soap.  Pat yourself dry with a CLEAN TOWEL.  Wear CLEAN PAJAMAS to bed the night before surgery  Place CLEAN SHEETS on your bed the night before your surgery  DO NOT SLEEP WITH PETS.   Day of Surgery:  Take a shower with CHG soap. Wear Clean/Comfortable clothing the morning of surgery Do not apply any deodorants/lotions.   Remember to brush your teeth WITH YOUR REGULAR TOOTHPASTE.    If you received a COVID test during your pre-op visit, it is requested that you wear a mask when out in public, stay away from anyone that may not be feeling well, and notify your surgeon if you develop symptoms. If you have been in contact with anyone that has tested positive in the last 10 days, please notify your surgeon.    Please read over the following fact sheets that you were given.

## 2021-09-10 ENCOUNTER — Encounter (HOSPITAL_COMMUNITY): Payer: Self-pay

## 2021-09-10 ENCOUNTER — Other Ambulatory Visit: Payer: Self-pay

## 2021-09-10 ENCOUNTER — Encounter (HOSPITAL_COMMUNITY)
Admission: RE | Admit: 2021-09-10 | Discharge: 2021-09-10 | Disposition: A | Discharge status: Home / Self Care | Payer: 59 | Source: Ambulatory Visit | Attending: Orthopaedic Surgery | Admitting: Orthopaedic Surgery

## 2021-09-10 ENCOUNTER — Other Ambulatory Visit: Payer: Self-pay | Admitting: Orthopaedic Surgery

## 2021-09-10 VITALS — BP 144/99 | HR 110 | Temp 98.2°F | Resp 17 | Ht 69.0 in | Wt 201.5 lb

## 2021-09-10 DIAGNOSIS — Z01818 Encounter for other preprocedural examination: Secondary | ICD-10-CM | POA: Diagnosis present

## 2021-09-10 DIAGNOSIS — M1612 Unilateral primary osteoarthritis, left hip: Secondary | ICD-10-CM | POA: Diagnosis not present

## 2021-09-10 DIAGNOSIS — I10 Essential (primary) hypertension: Secondary | ICD-10-CM | POA: Diagnosis not present

## 2021-09-10 HISTORY — DX: Unspecified osteoarthritis, unspecified site: M19.90

## 2021-09-10 LAB — CBC
HCT: 41.3 % (ref 39.0–52.0)
Hemoglobin: 13.6 g/dL (ref 13.0–17.0)
MCH: 28.8 pg (ref 26.0–34.0)
MCHC: 32.9 g/dL (ref 30.0–36.0)
MCV: 87.5 fL (ref 80.0–100.0)
Platelets: 260 10*3/uL (ref 150–400)
RBC: 4.72 MIL/uL (ref 4.22–5.81)
RDW: 13.5 % (ref 11.5–15.5)
WBC: 5.5 10*3/uL (ref 4.0–10.5)
nRBC: 0 % (ref 0.0–0.2)

## 2021-09-10 LAB — BASIC METABOLIC PANEL
Anion gap: 4 — ABNORMAL LOW (ref 5–15)
BUN: 16 mg/dL (ref 8–23)
CO2: 28 mmol/L (ref 22–32)
Calcium: 9.2 mg/dL (ref 8.9–10.3)
Chloride: 105 mmol/L (ref 98–111)
Creatinine, Ser: 0.9 mg/dL (ref 0.61–1.24)
GFR, Estimated: >60 mL/min (ref >60)
Glucose, Bld: 89 mg/dL (ref 70–99)
Potassium: 4.1 mmol/L (ref 3.5–5.1)
Sodium: 137 mmol/L (ref 135–145)

## 2021-09-10 LAB — TYPE AND SCREEN
ABO/RH(D): B POS
Antibody Screen: NEGATIVE

## 2021-09-10 LAB — SURGICAL PCR SCREEN
MRSA, PCR: NEGATIVE
Staphylococcus aureus: NEGATIVE

## 2021-09-10 MED ORDER — HYDROCODONE-ACETAMINOPHEN 5-325 MG PO TABS: ORAL_TABLET | ORAL | 0 refills | Status: DC

## 2021-09-10 NOTE — Progress Notes (Signed)
Surgical Instructions    Your procedure is scheduled on Tuesday May 30th.  Report to [facility] Main Entrance "A" at 7:40 A.M., then check in with the Admitting office.  Call this number if you have problems the morning of surgery:  [phone]   If you have any questions prior to your surgery date call [phone]: Open Monday-Friday 8am-4pm    Remember:  Do not eat after midnight the night before your surgery  You may drink clear liquids until 6:40 am the morning of your surgery.   Clear liquids allowed are: Water, Non-Citrus Juices (without pulp), Carbonated Beverages, Clear Tea, Black Coffee ONLY (NO MILK, CREAM OR POWDERED CREAMER of any kind), and Gatorade    Take these medicines the morning of surgery with A SIP OF WATER:  IF NEEDED acetaminophen (TYLENOL)  HYDROcodone-acetaminophen (NORCO/VICODIN) 5-325 MG tablet    As of today, STOP taking any Aspirin (unless otherwise instructed by your surgeon) Aleve, Naproxen, Ibuprofen, Motrin, Advil, Goody's, BC's, all herbal medications, fish oil, and all vitamins.           Do not wear jewelry  Do not wear lotions, powders, colognes, or deodorant. Do not shave 48 hours prior to surgery.  Men may shave face and neck. Do not bring valuables to the hospital. Do not wear nail polish  [facility] is not responsible for any belongings or valuables. .   Do NOT Smoke (Tobacco/Vaping)  24 hours prior to your procedure  If you use a CPAP at night, you may bring your mask for your overnight stay.   Contacts, glasses, hearing aids, dentures or partials may not be worn into surgery, please bring cases for these belongings   For patients admitted to the hospital, discharge time will be determined by your treatment team.   Patients discharged the day of surgery will not be allowed to drive home, and someone needs to stay with them for 24 hours.   SURGICAL WAITING ROOM VISITATION Patients having surgery or a procedure in a hospital  may have two support people. Children under the age of 69 must have an adult with them who is not the patient. They may stay in the waiting area during the procedure and may switch out with other visitors. If the patient needs to stay at the hospital during part of their recovery, the visitor guidelines for inpatient rooms apply.  Please refer to the [facility] website for the visitor guidelines for Inpatients (after your surgery is over and you are in a regular room).       Special instructions:    Oral Hygiene is also important to reduce your risk of infection.  Remember - BRUSH YOUR TEETH THE MORNING OF SURGERY WITH YOUR REGULAR TOOTHPASTE   [facility]- Preparing For Surgery  Before surgery, you can play an important role. Because skin is not sterile, your skin needs to be as free of germs as possible. You can reduce the number of germs on your skin by washing with CHG (chlorahexidine gluconate) Soap before surgery.  CHG is an antiseptic cleaner which kills germs and bonds with the skin to continue killing germs even after washing.     Please do not use if you have an allergy to CHG or antibacterial soaps. If your skin becomes reddened/irritated stop using the CHG.  Do not shave (including legs and underarms) for at least 48 hours prior to first CHG shower. It is OK to shave your face.  Please follow these instructions carefully.  Shower the NIGHT BEFORE SURGERY and the MORNING OF SURGERY with CHG Soap.   If you chose to wash your hair, wash your hair first as usual with your normal shampoo. After you shampoo, rinse your hair and body thoroughly to remove the shampoo.  Then [employer] and genitals (private parts) with your normal soap and rinse thoroughly to remove soap.  After that Use CHG Soap as you would any other liquid soap. You can apply CHG directly to the skin and wash gently with a scrungie or a clean washcloth.   Apply the CHG Soap to your body ONLY FROM THE NECK DOWN.   Do not use on open wounds or open sores. Avoid contact with your eyes, ears, mouth and genitals (private parts). Wash Face and genitals (private parts)  with your normal soap.   Wash thoroughly, paying special attention to the area where your surgery will be performed.  Thoroughly rinse your body with warm water from the neck down.  DO NOT shower/wash with your normal soap after using and rinsing off the CHG Soap.  Pat yourself dry with a CLEAN TOWEL.  Wear CLEAN PAJAMAS to bed the night before surgery  Place CLEAN SHEETS on your bed the night before your surgery  DO NOT SLEEP WITH PETS.   Day of Surgery:  Take a shower with CHG soap. Wear Clean/Comfortable clothing the morning of surgery Do not apply any deodorants/lotions.   Remember to brush your teeth WITH YOUR REGULAR TOOTHPASTE.    If you received a COVID test during your pre-op visit, it is requested that you wear a mask when out in public, stay away from anyone that may not be feeling well, and notify your surgeon if you develop symptoms. If you have been in contact with anyone that has tested positive in the last 10 days, please notify your surgeon.    Please read over the following fact sheets that you were given.

## 2021-09-10 NOTE — Progress Notes (Signed)
PCP - Dr. Sharilyn Sites Cardiologist - denies  PPM/ICD - denies   Chest x-ray - 12/24/2001- ABD w/ chest 1 view EKG - 09/10/21 at PAT Stress Test - denies ECHO - denies Cardiac Cath - denies  Sleep Study - denies, pt states he has a home sleep study kit that he has not done yet   DM- denies  ASA/Blood Thinner Instructions: n/a   ERAS Protcol - yes PRE-SURGERY Ensure given at PAT  COVID TEST- n/a   Anesthesia review: no  Patient denies shortness of breath, fever, cough and chest pain at PAT appointment   All instructions explained to the patient, with a verbal understanding of the material. Patient agrees to go over the instructions while at home for a better understanding. Patient also instructed to notify surgeon of any contact with COVID+ person or if he develops any symptoms. The opportunity to ask questions was provided.

## 2021-09-16 ENCOUNTER — Other Ambulatory Visit: Payer: Self-pay

## 2021-09-16 ENCOUNTER — Encounter (HOSPITAL_COMMUNITY)
Admission: RE | Disposition: A | Discharge status: Home Health | Payer: Self-pay | Source: Home / Self Care | Attending: Orthopaedic Surgery

## 2021-09-16 ENCOUNTER — Ambulatory Visit (HOSPITAL_COMMUNITY): Payer: 59 | Admitting: Certified Registered Nurse Anesthetist

## 2021-09-16 ENCOUNTER — Observation Stay (HOSPITAL_COMMUNITY): Payer: 59

## 2021-09-16 ENCOUNTER — Encounter (HOSPITAL_COMMUNITY): Payer: Self-pay | Admitting: Orthopaedic Surgery

## 2021-09-16 ENCOUNTER — Ambulatory Visit (HOSPITAL_BASED_OUTPATIENT_CLINIC_OR_DEPARTMENT_OTHER): Payer: 59 | Admitting: Certified Registered Nurse Anesthetist

## 2021-09-16 ENCOUNTER — Ambulatory Visit (HOSPITAL_COMMUNITY): Payer: 59

## 2021-09-16 ENCOUNTER — Observation Stay (HOSPITAL_COMMUNITY)
Admission: RE | Admit: 2021-09-16 | Discharge: 2021-09-17 | Disposition: A | Discharge status: Home Health | Payer: 59 | Attending: Orthopaedic Surgery | Admitting: Orthopaedic Surgery

## 2021-09-16 DIAGNOSIS — M1612 Unilateral primary osteoarthritis, left hip: Principal | ICD-10-CM

## 2021-09-16 DIAGNOSIS — Z96642 Presence of left artificial hip joint: Secondary | ICD-10-CM

## 2021-09-16 DIAGNOSIS — Z87891 Personal history of nicotine dependence: Secondary | ICD-10-CM | POA: Insufficient documentation

## 2021-09-16 DIAGNOSIS — I10 Essential (primary) hypertension: Secondary | ICD-10-CM | POA: Diagnosis not present

## 2021-09-16 HISTORY — PX: TOTAL HIP ARTHROPLASTY: SHX124

## 2021-09-16 LAB — ABO/RH: ABO/RH(D): B POS

## 2021-09-16 SURGERY — ARTHROPLASTY, HIP, TOTAL, ANTERIOR APPROACH
Anesthesia: Spinal | Site: Hip | Laterality: Left

## 2021-09-16 MED ORDER — METOCLOPRAMIDE HCL 5 MG PO TABS: 5.0000 mg | ORAL_TABLET | Freq: Three times a day (TID) | ORAL | Status: DC | PRN

## 2021-09-16 MED ORDER — ADULT MULTIVITAMIN W/MINERALS CH
1.0000 | ORAL_TABLET | Freq: Every morning | ORAL | Status: DC
  Administered 2021-09-17: 1 via ORAL
  Filled 2021-09-16: qty 1

## 2021-09-16 MED ORDER — ALUM & MAG HYDROXIDE-SIMETH 200-200-20 MG/5ML PO SUSP: 30.0000 mL | ORAL | Status: DC | PRN

## 2021-09-16 MED ORDER — DIPHENHYDRAMINE HCL 12.5 MG/5ML PO ELIX: 12.5000 mg | ORAL_SOLUTION | ORAL | Status: DC | PRN

## 2021-09-16 MED ORDER — SODIUM CHLORIDE 0.9 % IV SOLN
INTRAVENOUS | Status: DC
Start: 2021-09-16 — End: 2021-09-17

## 2021-09-16 MED ORDER — HYDROMORPHONE HCL 1 MG/ML IJ SOLN
0.5000 mg | INTRAMUSCULAR | Status: DC | PRN
  Administered 2021-09-16: 0.5 mg via INTRAVENOUS
  Administered 2021-09-16: 1 mg via INTRAVENOUS
  Filled 2021-09-16 (×2): qty 1

## 2021-09-16 MED ORDER — ONDANSETRON HCL 4 MG PO TABS: 4.0000 mg | ORAL_TABLET | Freq: Four times a day (QID) | ORAL | Status: DC | PRN

## 2021-09-16 MED ORDER — FENTANYL CITRATE (PF) 250 MCG/5ML IJ SOLN
INTRAMUSCULAR | Status: DC | PRN
  Administered 2021-09-16: 50 ug via INTRAVENOUS

## 2021-09-16 MED ORDER — PROPOFOL 10 MG/ML IV BOLUS
INTRAVENOUS | Status: DC | PRN
  Administered 2021-09-16 (×2): 30 mg via INTRAVENOUS

## 2021-09-16 MED ORDER — ONDANSETRON HCL 4 MG/2ML IJ SOLN
INTRAMUSCULAR | Status: DC | PRN
  Administered 2021-09-16: 4 mg via INTRAVENOUS

## 2021-09-16 MED ORDER — OXYCODONE HCL 5 MG PO TABS
5.0000 mg | ORAL_TABLET | ORAL | Status: DC | PRN
  Administered 2021-09-16: 5 mg via ORAL
  Filled 2021-09-16 (×2): qty 2

## 2021-09-16 MED ORDER — 0.9 % SODIUM CHLORIDE (POUR BTL) OPTIME
TOPICAL | Status: DC | PRN
  Administered 2021-09-16: 1000 mL

## 2021-09-16 MED ORDER — OXYCODONE HCL 5 MG PO TABS
ORAL_TABLET | ORAL | Status: AC
  Filled 2021-09-16: qty 1

## 2021-09-16 MED ORDER — PANTOPRAZOLE SODIUM 40 MG PO TBEC
40.0000 mg | DELAYED_RELEASE_TABLET | Freq: Every day | ORAL | Status: DC
Start: 2021-09-16 — End: 2021-09-17
  Administered 2021-09-16 – 2021-09-17 (×2): 40 mg via ORAL
  Filled 2021-09-16 (×2): qty 1

## 2021-09-16 MED ORDER — DOCUSATE SODIUM 100 MG PO CAPS
100.0000 mg | ORAL_CAPSULE | Freq: Two times a day (BID) | ORAL | Status: DC
  Administered 2021-09-16 – 2021-09-17 (×3): 100 mg via ORAL
  Filled 2021-09-16 (×3): qty 1

## 2021-09-16 MED ORDER — MENTHOL 3 MG MT LOZG
1.0000 | LOZENGE | OROMUCOSAL | Status: DC | PRN
Start: 2021-09-16 — End: 2021-09-17

## 2021-09-16 MED ORDER — CEFAZOLIN SODIUM-DEXTROSE 1-4 GM/50ML-% IV SOLN
1.0000 g | Freq: Four times a day (QID) | INTRAVENOUS | Status: AC
  Administered 2021-09-16 (×2): 1 g via INTRAVENOUS
  Filled 2021-09-16 (×2): qty 50

## 2021-09-16 MED ORDER — METHOCARBAMOL 500 MG PO TABS
500.0000 mg | ORAL_TABLET | Freq: Four times a day (QID) | ORAL | Status: DC | PRN
  Administered 2021-09-16 – 2021-09-17 (×3): 500 mg via ORAL
  Filled 2021-09-16 (×2): qty 1

## 2021-09-16 MED ORDER — ONDANSETRON HCL 4 MG/2ML IJ SOLN
INTRAMUSCULAR | Status: AC
  Filled 2021-09-16: qty 4

## 2021-09-16 MED ORDER — ALBUMIN HUMAN 5 % IV SOLN
INTRAVENOUS | Status: AC
  Filled 2021-09-16: qty 250

## 2021-09-16 MED ORDER — SODIUM CHLORIDE 0.9 % IR SOLN
Status: DC | PRN
  Administered 2021-09-16: 1000 mL

## 2021-09-16 MED ORDER — ASPIRIN 81 MG PO CHEW
81.0000 mg | CHEWABLE_TABLET | Freq: Two times a day (BID) | ORAL | Status: DC
  Administered 2021-09-16 – 2021-09-17 (×2): 81 mg via ORAL
  Filled 2021-09-16 (×2): qty 1

## 2021-09-16 MED ORDER — PHENOL 1.4 % MT LIQD: 1.0000 | OROMUCOSAL | Status: DC | PRN

## 2021-09-16 MED ORDER — METHOCARBAMOL 500 MG PO TABS
ORAL_TABLET | ORAL | Status: AC
  Filled 2021-09-16: qty 1

## 2021-09-16 MED ORDER — MIDAZOLAM HCL 2 MG/2ML IJ SOLN
INTRAMUSCULAR | Status: DC | PRN
  Administered 2021-09-16: 2 mg via INTRAVENOUS

## 2021-09-16 MED ORDER — PHENYLEPHRINE HCL-NACL 20-0.9 MG/250ML-% IV SOLN
INTRAVENOUS | Status: DC | PRN
  Administered 2021-09-16: 20 ug/min via INTRAVENOUS
  Administered 2021-09-16: 40 ug/min via INTRAVENOUS

## 2021-09-16 MED ORDER — MIDAZOLAM HCL 2 MG/2ML IJ SOLN
INTRAMUSCULAR | Status: AC
  Filled 2021-09-16: qty 2

## 2021-09-16 MED ORDER — METHOCARBAMOL 1000 MG/10ML IJ SOLN
500.0000 mg | Freq: Four times a day (QID) | INTRAVENOUS | Status: DC | PRN
  Filled 2021-09-16: qty 5

## 2021-09-16 MED ORDER — CEFAZOLIN SODIUM-DEXTROSE 2-4 GM/100ML-% IV SOLN
2.0000 g | INTRAVENOUS | Status: AC
  Administered 2021-09-16: 2 g via INTRAVENOUS
  Filled 2021-09-16: qty 100

## 2021-09-16 MED ORDER — BUPIVACAINE IN DEXTROSE 0.75-8.25 % IT SOLN
INTRATHECAL | Status: DC | PRN
  Administered 2021-09-16: 1.6 mL via INTRATHECAL

## 2021-09-16 MED ORDER — ONDANSETRON HCL 4 MG/2ML IJ SOLN: 4.0000 mg | Freq: Four times a day (QID) | INTRAMUSCULAR | Status: DC | PRN

## 2021-09-16 MED ORDER — PROPOFOL 500 MG/50ML IV EMUL
INTRAVENOUS | Status: DC | PRN
  Administered 2021-09-16: 75 ug/kg/min via INTRAVENOUS

## 2021-09-16 MED ORDER — POVIDONE-IODINE 10 % EX SWAB
2.0000 "application " | Freq: Once | CUTANEOUS | Status: AC
  Administered 2021-09-16: 2 via TOPICAL

## 2021-09-16 MED ORDER — TRANEXAMIC ACID-NACL 1000-0.7 MG/100ML-% IV SOLN
1000.0000 mg | INTRAVENOUS | Status: AC
  Administered 2021-09-16: 1000 mg via INTRAVENOUS
  Filled 2021-09-16: qty 100

## 2021-09-16 MED ORDER — FENTANYL CITRATE (PF) 250 MCG/5ML IJ SOLN
INTRAMUSCULAR | Status: AC
  Filled 2021-09-16: qty 5

## 2021-09-16 MED ORDER — ALBUMIN HUMAN 5 % IV SOLN
12.5000 g | Freq: Once | INTRAVENOUS | Status: AC
  Administered 2021-09-16: 12.5 g via INTRAVENOUS

## 2021-09-16 MED ORDER — OXYCODONE HCL 5 MG PO TABS
10.0000 mg | ORAL_TABLET | ORAL | Status: DC | PRN
  Administered 2021-09-16 – 2021-09-17 (×4): 10 mg via ORAL
  Filled 2021-09-16 (×2): qty 2

## 2021-09-16 MED ORDER — FENTANYL CITRATE (PF) 100 MCG/2ML IJ SOLN: 25.0000 ug | INTRAMUSCULAR | Status: DC | PRN

## 2021-09-16 MED ORDER — ACETAMINOPHEN 500 MG PO TABS
1000.0000 mg | ORAL_TABLET | Freq: Once | ORAL | Status: AC
  Administered 2021-09-16: 1000 mg via ORAL
  Filled 2021-09-16: qty 2

## 2021-09-16 MED ORDER — METOCLOPRAMIDE HCL 5 MG/ML IJ SOLN: 5.0000 mg | Freq: Three times a day (TID) | INTRAMUSCULAR | Status: DC | PRN

## 2021-09-16 MED ORDER — ORAL CARE MOUTH RINSE: 15.0000 mL | Freq: Once | OROMUCOSAL | Status: AC

## 2021-09-16 MED ORDER — CHLORHEXIDINE GLUCONATE 0.12 % MT SOLN
15.0000 mL | Freq: Once | OROMUCOSAL | Status: AC
  Administered 2021-09-16: 15 mL via OROMUCOSAL
  Filled 2021-09-16: qty 15

## 2021-09-16 MED ORDER — ACETAMINOPHEN 325 MG PO TABS
325.0000 mg | ORAL_TABLET | Freq: Four times a day (QID) | ORAL | Status: DC | PRN
  Administered 2021-09-17: 650 mg via ORAL
  Filled 2021-09-16: qty 2

## 2021-09-16 MED ORDER — LACTATED RINGERS IV SOLN: INTRAVENOUS | Status: DC

## 2021-09-16 MED ORDER — LOSARTAN POTASSIUM 50 MG PO TABS
50.0000 mg | ORAL_TABLET | Freq: Every morning | ORAL | Status: DC
  Administered 2021-09-17: 50 mg via ORAL
  Filled 2021-09-16: qty 1

## 2021-09-16 SURGICAL SUPPLY — 57 items
APL SKNCLS STERI-STRIP NONHPOA (GAUZE/BANDAGES/DRESSINGS) ×1
BAG COUNTER SPONGE SURGICOUNT (BAG) ×2 IMPLANT
BAG SPNG CNTER NS LX DISP (BAG) ×1
BENZOIN TINCTURE PRP APPL 2/3 (GAUZE/BANDAGES/DRESSINGS) ×2 IMPLANT
BLADE CLIPPER SURG (BLADE) IMPLANT
BLADE SAW SGTL 18X1.27X75 (BLADE) ×2 IMPLANT
COVER SURGICAL LIGHT HANDLE (MISCELLANEOUS) ×2 IMPLANT
CUP SECTOR GRIPTON 58MM (Orthopedic Implant) ×1 IMPLANT
DRAPE C-ARM 42X72 X-RAY (DRAPES) ×2 IMPLANT
DRAPE STERI IOBAN 125X83 (DRAPES) ×2 IMPLANT
DRAPE U-SHAPE 47X51 STRL (DRAPES) ×6 IMPLANT
DRESSING AQUACEL AG SP 3.5X10 (GAUZE/BANDAGES/DRESSINGS) IMPLANT
DRSG AQUACEL AG ADV 3.5X10 (GAUZE/BANDAGES/DRESSINGS) ×2 IMPLANT
DRSG AQUACEL AG SP 3.5X10 (GAUZE/BANDAGES/DRESSINGS)
DURAPREP 26ML APPLICATOR (WOUND CARE) ×2 IMPLANT
ELECT BLADE 4.0 EZ CLEAN MEGAD (MISCELLANEOUS) ×2
ELECT BLADE 6.5 EXT (BLADE) IMPLANT
ELECT REM PT RETURN 9FT ADLT (ELECTROSURGICAL) ×2
ELECTRODE BLDE 4.0 EZ CLN MEGD (MISCELLANEOUS) ×1 IMPLANT
ELECTRODE REM PT RTRN 9FT ADLT (ELECTROSURGICAL) ×1 IMPLANT
FACESHIELD WRAPAROUND (MASK) ×6 IMPLANT
FACESHIELD WRAPAROUND OR TEAM (MASK) ×2 IMPLANT
GLOVE BIOGEL PI IND STRL 8 (GLOVE) ×2 IMPLANT
GLOVE BIOGEL PI INDICATOR 8 (GLOVE) ×2
GLOVE ECLIPSE 8.0 STRL XLNG CF (GLOVE) ×2 IMPLANT
GLOVE ORTHO TXT STRL SZ7.5 (GLOVE) ×4 IMPLANT
GOWN STRL REUS W/ TWL LRG LVL3 (GOWN DISPOSABLE) ×2 IMPLANT
GOWN STRL REUS W/ TWL XL LVL3 (GOWN DISPOSABLE) ×2 IMPLANT
GOWN STRL REUS W/TWL LRG LVL3 (GOWN DISPOSABLE) ×4
GOWN STRL REUS W/TWL XL LVL3 (GOWN DISPOSABLE) ×4
HANDPIECE INTERPULSE COAX TIP (DISPOSABLE) ×2
HEAD CERAMIC 36 PLUS 8.5 12 14 (Hips) ×1 IMPLANT
KIT BASIN OR (CUSTOM PROCEDURE TRAY) ×2 IMPLANT
KIT TURNOVER KIT B (KITS) ×2 IMPLANT
LINER NEUTRAL 36X58 PLUS4 ×1 IMPLANT
MANIFOLD NEPTUNE II (INSTRUMENTS) ×2 IMPLANT
NS IRRIG 1000ML POUR BTL (IV SOLUTION) ×2 IMPLANT
PACK TOTAL JOINT (CUSTOM PROCEDURE TRAY) ×2 IMPLANT
PAD ARMBOARD 7.5X6 YLW CONV (MISCELLANEOUS) ×2 IMPLANT
SET HNDPC FAN SPRY TIP SCT (DISPOSABLE) ×1 IMPLANT
STAPLER VISISTAT 35W (STAPLE) ×1 IMPLANT
STEM FEM ACTIS HIGH SZ8 (Stem) ×1 IMPLANT
STRIP CLOSURE SKIN 1/2X4 (GAUZE/BANDAGES/DRESSINGS) ×2 IMPLANT
SUT ETHIBOND NAB CT1 #1 30IN (SUTURE) ×2 IMPLANT
SUT MNCRL AB 4-0 PS2 18 (SUTURE) IMPLANT
SUT VIC AB 0 CT1 27 (SUTURE) ×2
SUT VIC AB 0 CT1 27XBRD ANBCTR (SUTURE) ×1 IMPLANT
SUT VIC AB 1 CT1 27 (SUTURE) ×2
SUT VIC AB 1 CT1 27XBRD ANBCTR (SUTURE) ×1 IMPLANT
SUT VIC AB 2-0 CT1 27 (SUTURE) ×2
SUT VIC AB 2-0 CT1 TAPERPNT 27 (SUTURE) ×1 IMPLANT
TOWEL GREEN STERILE (TOWEL DISPOSABLE) ×2 IMPLANT
TOWEL GREEN STERILE FF (TOWEL DISPOSABLE) ×2 IMPLANT
TRAY CATH 16FR W/PLASTIC CATH (SET/KITS/TRAYS/PACK) IMPLANT
TRAY FOLEY W/BAG SLVR 16FR (SET/KITS/TRAYS/PACK) ×2
TRAY FOLEY W/BAG SLVR 16FR ST (SET/KITS/TRAYS/PACK) IMPLANT
WATER STERILE IRR 1000ML POUR (IV SOLUTION) ×2 IMPLANT

## 2021-09-16 NOTE — Anesthesia Preprocedure Evaluation (Addendum)
Anesthesia Evaluation  Patient identified by MRN, date of birth, ID band Patient awake    Reviewed: Allergy & Precautions, H&P , NPO status , Patient's Chart, lab work & pertinent test results  Airway Mallampati: II  TM Distance: >3 FB Neck ROM: Full    Dental no notable dental hx. (+) Edentulous Upper, Partial Lower, Dental Advisory Given   Pulmonary neg pulmonary ROS, former smoker,    Pulmonary exam normal breath sounds clear to auscultation       Cardiovascular hypertension, Pt. on medications  Rhythm:Regular Rate:Normal     Neuro/Psych negative neurological ROS  negative psych ROS   GI/Hepatic negative GI ROS, Neg liver ROS,   Endo/Other  negative endocrine ROS  Renal/GU negative Renal ROS  negative genitourinary   Musculoskeletal  (+) Arthritis , Osteoarthritis,    Abdominal   Peds  Hematology negative hematology ROS (+)   Anesthesia Other Findings   Reproductive/Obstetrics negative OB ROS                            Anesthesia Physical Anesthesia Plan  ASA: 2  Anesthesia Plan: Spinal   Post-op Pain Management: Tylenol PO (pre-op)*   Induction: Intravenous  PONV Risk Score and Plan: 2 and Propofol infusion, Midazolam and Ondansetron  Airway Management Planned: Natural Airway and Simple Face Mask  Additional Equipment:   Intra-op Plan:   Post-operative Plan: Extubation in OR  Informed Consent: I have reviewed the patients History and Physical, chart, labs and discussed the procedure including the risks, benefits and alternatives for the proposed anesthesia with the patient or authorized representative who has indicated his/her understanding and acceptance.     Dental advisory given  Plan Discussed with: CRNA  Anesthesia Plan Comments:         Anesthesia Quick Evaluation

## 2021-09-16 NOTE — Anesthesia Postprocedure Evaluation (Signed)
Anesthesia Post Note  Patient: Tim Grant  Procedure(s) Performed: LEFT TOTAL HIP ARTHROPLASTY ANTERIOR APPROACH (Left: Hip)     Patient location during evaluation: PACU Anesthesia Type: Spinal Level of consciousness: oriented and awake and alert Pain management: pain level controlled Vital Signs Assessment: post-procedure vital signs reviewed and stable Respiratory status: spontaneous breathing and respiratory function stable Cardiovascular status: blood pressure returned to baseline and stable Postop Assessment: no headache, no backache, no apparent nausea or vomiting, spinal receding and patient able to bend at knees Anesthetic complications: no   No notable events documented.  Last Vitals:  Vitals:   09/16/21 1320 09/16/21 1353  BP: 137/73 (!) 152/83  Pulse: (!) 39 (!) 42  Resp: 16 18  Temp: (!) 36.1 C 36.6 C  SpO2: 100% 100%    Last Pain:  Vitals:   09/16/21 1315  TempSrc:   PainSc: 6                  Kennie Snedden,W. EDMOND

## 2021-09-16 NOTE — Anesthesia Procedure Notes (Signed)
Spinal  Patient location during procedure: OR Start time: 09/16/2021 9:52 AM End time: 09/16/2021 9:57 AM Reason for block: surgical anesthesia Staffing Performed: anesthesiologist  Anesthesiologist: Gaynelle Adu, MD Preanesthetic Checklist Completed: patient identified, IV checked, risks and benefits discussed, surgical consent, monitors and equipment checked, pre-op evaluation and timeout performed Spinal Block Patient position: sitting Prep: DuraPrep Patient monitoring: cardiac monitor, continuous pulse ox and blood pressure Approach: midline Location: L3-4 Injection technique: single-shot Needle Needle type: Pencan  Needle gauge: 24 G Needle length: 9 cm Assessment Sensory level: T8 Events: CSF return Additional Notes Functioning IV was confirmed and monitors were applied. Sterile prep and drape, including hand hygiene and sterile gloves were used. The patient was positioned and the spine was prepped. The skin was anesthetized with lidocaine.  Free flow of clear CSF was obtained prior to injecting local anesthetic into the CSF.  The spinal needle aspirated freely following injection.  The needle was carefully withdrawn.  The patient tolerated the procedure well.

## 2021-09-16 NOTE — Brief Op Note (Signed)
09/16/2021  11:08 AM  PATIENT:  Tim Grant  66 y.o. male  PRE-OPERATIVE DIAGNOSIS:  OSTEOARTHRITIS / DEGENERATIVE JOINT DISEASE LEFT HIP  POST-OPERATIVE DIAGNOSIS:  OSTEOARTHRITIS / DEGENERATIVE JOINT   PROCEDURE:  Procedure(s): LEFT TOTAL HIP ARTHROPLASTY ANTERIOR APPROACH (Left)  SURGEON:  Surgeon(s) and Role:    * Mcarthur Rossetti, MD - Primary  PHYSICIAN ASSISTANT:  Benita Stabile, PA-C  ANESTHESIA:   spinal  EBL:  200 mL   COUNTS:  YES  DICTATION: .Other Dictation: Dictation Number [id number]  PLAN OF CARE: Admit for overnight observation  PATIENT DISPOSITION:  PACU - hemodynamically stable.   Delay start of Pharmacological VTE agent (>24hrs) due to surgical blood loss or risk of bleeding: no

## 2021-09-16 NOTE — H&P (Signed)
TOTAL HIP ADMISSION H&P  Patient is admitted for left total hip arthroplasty.  Subjective:  Chief Complaint: left hip pain  HPI: Tim Grant, 66 y.o. male, has a history of pain and functional disability in the left hip(s) due to arthritis and patient has failed non-surgical conservative treatments for greater than 12 weeks to include NSAID's and/or analgesics, corticosteriod injections, flexibility and strengthening excercises, use of assistive devices, weight reduction as appropriate, and activity modification.  Onset of symptoms was gradual starting 2 years ago with gradually worsening course since that time.The patient noted no past surgery on the left hip(s).  Patient currently rates pain in the left hip at 10 out of 10 with activity. Patient has night pain, worsening of pain with activity and weight bearing, trendelenberg gait, pain that interfers with activities of daily living, and pain with passive range of motion. Patient has evidence of subchondral cysts, subchondral sclerosis, periarticular osteophytes, and joint space narrowing by imaging studies. This condition presents safety issues increasing the risk of falls.  There is no current active infection.  Patient Active Problem List   Diagnosis Date Noted   Unilateral primary osteoarthritis, left hip 09/16/2021   Past Medical History:  Diagnosis Date   Arthritis    Hypertension     Past Surgical History:  Procedure Laterality Date   KNEE ARTHROSCOPY W/ DEBRIDEMENT Left    pt states this was many years ago    Current Facility-Administered Medications  Medication Dose Route Frequency Provider Last Rate Last Admin   ceFAZolin (ANCEF) IVPB 2g/100 mL premix  2 g Intravenous On Call to OR Kirtland Bouchard, PA-C       lactated ringers infusion   Intravenous Continuous Gaynelle Adu, MD 10 mL/hr at 09/16/21 0841 New Bag at 09/16/21 0841   tranexamic acid (CYKLOKAPRON) IVPB 1,000 mg  1,000 mg Intravenous To OR Kirtland Bouchard, PA-C       No Known Allergies  Social History   Tobacco Use   Smoking status: Former    Types: Cigarettes    Quit date: 1980    Years since quitting: 43.4   Smokeless tobacco: Never  Substance Use Topics   Alcohol use: Not Currently    History reviewed. No pertinent family history.   Review of Systems  Musculoskeletal:  Positive for gait problem.  All other systems reviewed and are negative.  Objective:  Physical Exam Vitals reviewed.  Constitutional:      Appearance: Normal appearance.  HENT:     Head: Normocephalic and atraumatic.  Eyes:     Extraocular Movements: Extraocular movements intact.     Pupils: Pupils are equal, round, and reactive to light.  Cardiovascular:     Rate and Rhythm: Normal rate and regular rhythm.  Pulmonary:     Effort: Pulmonary effort is normal.     Breath sounds: Normal breath sounds.  Abdominal:     Palpations: Abdomen is soft.  Musculoskeletal:     Cervical back: Normal range of motion and neck supple.     Left hip: Tenderness and bony tenderness present. Decreased range of motion. Decreased strength.  Neurological:     Mental Status: He is alert and oriented to person, place, and time.  Psychiatric:        Behavior: Behavior normal.    Vital signs in last 24 hours: Temp:  [98.8 F (37.1 C)] 98.8 F (37.1 C) (05/30 0815) Pulse Rate:  [80] 80 (05/30 0815) Resp:  [18] 18 (05/30 0815) BP: (  153)/(90) 153/90 (05/30 0815) SpO2:  [100 %] 100 % (05/30 0815) Weight:  [95.3 kg] 95.3 kg (05/30 0815)  Labs:   Estimated body mass index is 31.01 kg/m as calculated from the following:   Height as of this encounter: 5\' 9"  (1.753 m).   Weight as of this encounter: 95.3 kg.   Imaging Review Plain radiographs demonstrate severe degenerative joint disease of the left hip(s). The bone quality appears to be good for age and reported activity level.      Assessment/Plan:  End stage arthritis, left hip(s)  The patient history,  physical examination, clinical judgement of the provider and imaging studies are consistent with end stage degenerative joint disease of the left hip(s) and total hip arthroplasty is deemed medically necessary. The treatment options including medical management, injection therapy, arthroscopy and arthroplasty were discussed at length. The risks and benefits of total hip arthroplasty were presented and reviewed. The risks due to aseptic loosening, infection, stiffness, dislocation/subluxation,  thromboembolic complications and other imponderables were discussed.  The patient acknowledged the explanation, agreed to proceed with the plan and consent was signed. Patient is being admitted for inpatient treatment for surgery, pain control, PT, OT, prophylactic antibiotics, VTE prophylaxis, progressive ambulation and ADL's and discharge planning.The patient is planning to be discharged home with home health services

## 2021-09-16 NOTE — Op Note (Signed)
Tim Grant, Tim Grant MEDICAL RECORD NO: [id number] ACCOUNT NO: [id number] DATE OF BIRTH: Aug 22, 1955 FACILITY: [facility] LOCATION: [facility] PHYSICIAN: Vanita Panda. Magnus Ivan, MD  Operative Report   DATE OF PROCEDURE: 09/16/2021  PREOPERATIVE DIAGNOSIS:  Primary osteoarthritis and degenerative joint disease, left hip.  POSTOPERATIVE DIAGNOSIS:  Primary osteoarthritis and degenerative joint disease, left hip.  PROCEDURE:  Left total hip arthroplasty through direct anterior approach.  IMPLANTS:  DePuy sector Gription acetabular component size 58, size 36+4 neutral polyethylene liner, size 8 ACTIS femoral component with high offset, size 36+8.5 ceramic hip ball.  SURGEON:  Vanita Panda. Magnus Ivan, MD  ASSISTANT:  Richardean Canal, PA-C  ANESTHESIA:  Spinal.  ANTIBIOTICS:  2 g IV Ancef.  ESTIMATED BLOOD LOSS:  200 mL  COMPLICATIONS:  None.  INDICATIONS:  The patient is a 66 year old pleasant individual who unfortunately developed debilitating arthritis involving his left hip.  This is evident on plain films and clinical exam.  Even MRI of his left hip showed severe osteoarthritis.  At this  point, his left hip pain is daily and it is detrimentally affecting his mobility, his quality of life and his activities of daily living to the point he does wish to proceed with a total hip arthroplasty and we have recommended this as well.  We talked  in length and detail about the risk of acute blood loss anemia, nerve or vessel injury, fracture, infection, dislocation, DVT, leg length differences and skin and soft tissue issues.  We talked about our goals being decreased pain, improve mobility and  overall improve quality of life.  DESCRIPTION OF PROCEDURE:  After informed consent was obtained, appropriate left hip was marked, he was brought to the operating room and sat up on the stretcher where spinal anesthesia was obtained.  He was laid in supine position on the stretcher.   Traction boots were  placed on both his feet.  A Foley catheter was placed and he was placed supine on the Hana fracture table with the perineal post in place with no traction applied.  His left operative hip was prepped and draped in DuraPrep and sterile  drapes.  A timeout was called and he was identified correct patient, correct left hip.  I then made an incision just inferior and posterior to the anterior superior iliac spine and carried this obliquely down the leg.  We dissected down tensor fascia  lata muscle.  Tensor fascia was then divided longitudinally to proceed with direct anterior approach to the hip.  We identified and cauterized circumflex vessels and then identified the hip capsule, opened the hip capsule in L-type format finding a  moderate joint effusion.  We placed Cobra retractor around the medial and lateral femoral neck and made a femoral neck cut with an oscillating saw and completed this with an osteotome.  We placed a corkscrew guide in the femoral head and removed the  femoral head its entirety and found a wide area devoid of cartilage.  We then placed a bent Hohmann over the medial acetabular rim and removed remnants of acetabular labrum and other debris.  We then began reaming under direct visualization from a size  43 reamer going all the way up to a size 57 reamer with all reamers placed under direct visualization.  Last reamer was also placed under direct fluoroscopy, so we could obtain our depth of reaming, our inclination and anteversion.  I then placed real  DePuy sector Gription acetabular component size 58 and a 36+4 polyethylene liner based off offset.  Attention was then turned to the femur.  With the leg externally rotated to 120 degrees and extended and adducted we were able to place a Mueller  retractor medially and Hohmann retractor behind the greater trochanter.  We released lateral joint capsule and used a box cutting osteotome to enter femoral canal and a rongeur to lateralize. We then  began broaching using the ACTIS broaching system from  a size 0 going up to a size 7.  With a size 7 in place, we trialed a high offset femoral neck and a 36+1.5 hip ball and reduced easily.  We definitely needed more leg length and likely more offset.  At that point, we dislocated the hip and it was decided  to go up to a size 8 femoral component to leave this slightly proud.  We trialed up to a size 8 and I was very pleased with the size 8 femoral component, so we went with the real ACTIS femoral component, size 8 with high offset.  I placed in the  acetabulum easily.  Based on what we felt like we needed to improve on his leg length we went up to a size 8.5 ceramic hip ball, we reduced this in acetabulum. After placing 36+8.5 hip ball and the high offset size 8 ACTIS femoral component.  Once it was  reduced in the acetabulum we were pleased with leg length, offset, range of motion and stability assessed radiographically and clinically.  We then irrigated the soft tissue with normal saline solution using pulsatile lavage.  The joint capsule was  closed with interrupted #1 Ethibond suture followed by #1 Vicryl to close the tensor fascia.  0 Vicryl was used to close deep tissue and 2-0 Vicryl was used to close subcutaneous tissue.  The skin was closed with staples.  An Aquacel dressing was  applied.  He was taken off the Hana table and taken to recovery room in stable condition with all counts being correct.  No complications noted.  Of note, Rexene Edison, PA-C, assisted from the beginning to ending of this case and his assistance was  medically necessary and helpful with retracting soft tissues, helping guide implant placement and layered closure of the wound.   PUS D: 09/16/2021 11:07:30 am T: 09/16/2021 11:26:00 am  JOB: [id number]/ [id number]

## 2021-09-16 NOTE — Evaluation (Signed)
Physical Therapy Evaluation Patient Details Name: Tim Grant MRN: [id number] DOB: March 16, 1956 Today's Date: 09/16/2021  History of Present Illness  pt is a 66 y/o male admitted 5/30 for elective L THA,  having failed conservative treatments.  PMHx:HTN, OA left hip, knee arthroscopy  Clinical Impression  Pt admitted with/for elective L THA.  Pt at min to min guard level post sx..  Pt currently limited functionally due to the problems listed below.  (see problems list.)  Pt will benefit from PT to maximize function and safety to be able to get home safely with available assist.        Recommendations for follow up therapy are one component of a multi-disciplinary discharge planning process, led by the attending physician.  Recommendations may be updated based on patient status, additional functional criteria and insurance authorization.  Follow Up Recommendations Follow physician's recommendations for discharge plan and follow up therapies    Assistance Recommended at Discharge Set up Supervision/Assistance  Patient can return home with the following  A little help with walking and/or transfers;A little help with bathing/dressing/bathroom;Assistance with cooking/housework;Assist for transportation;Help with stairs or ramp for entrance    Equipment Recommendations Rolling walker (2 wheels);BSC/3in1  Recommendations for Other Services       Functional Status Assessment Patient has had a recent decline in their functional status and demonstrates the ability to make significant improvements in function in a reasonable and predictable amount of time.     Precautions / Restrictions Precautions Precautions: Anterior Hip Precaution Booklet Issued:  (hand out of exercises given) Restrictions Weight Bearing Restrictions:  (full w/bearing)      Mobility  Bed Mobility Overal bed mobility: Needs Assistance Bed Mobility: Supine to Sit, Sit to Supine     Supine to sit: Min assist Sit  to supine: Mod assist   General bed mobility comments: Surgical leg assist in/out    Transfers Overall transfer level: Needs assistance Equipment used: Rolling walker (2 wheels) Transfers: Sit to/from Stand Sit to Stand: Min assist (MIN Guard for descent)           General transfer comment: cues for hand placement, options for decreasing pain.    Ambulation/Gait Ambulation/Gait assistance: Min guard Gait Distance (Feet): 150 Feet Assistive device: Rolling walker (2 wheels) Gait Pattern/deviations: Step-to pattern, Step-through pattern   Gait velocity interpretation: <1.8 ft/sec, indicate of risk for recurrent falls   General Gait Details: generally steady with mild antalgic gait L LE.  Stairs            Wheelchair Mobility    Modified Rankin (Stroke Patients Only)       Balance Overall balance assessment: No apparent balance deficits (not formally assessed)                                           Pertinent Vitals/Pain Pain Assessment Pain Assessment: 0-10 Pain Score: 9  Pain Location: groin Pain Descriptors / Indicators: Aching, Discomfort, Guarding Pain Intervention(s): Monitored during session, Premedicated before session, Patient requesting pain meds-RN notified    Home Living Family/patient expects to be discharged to:: Private residence Living Arrangements: Spouse/significant other Available Help at Discharge: Family;Available 24 hours/day Type of Home: House Home Access: Stairs to enter Entrance Stairs-Rails: Doctor, general practice of Steps: 1/6   Home Layout: One level Home Equipment: None      Prior Function Prior Level of Function :  Independent/Modified Independent;Working/employed;Driving                     Hand Dominance        Extremity/Trunk Assessment   Upper Extremity Assessment Upper Extremity Assessment: Overall WFL for tasks assessed    Lower Extremity Assessment Lower Extremity  Assessment: Overall WFL for tasks assessed;LLE deficits/detail LLE Deficits / Details: surgical pain guarded /weakness    Cervical / Trunk Assessment Cervical / Trunk Assessment: Normal  Communication   Communication: No difficulties  Cognition Arousal/Alertness: Awake/alert Behavior During Therapy: WFL for tasks assessed/performed Overall Cognitive Status: Within Functional Limits for tasks assessed                                          General Comments General comments (skin integrity, edema, etc.): Initiate Ther ex education using handout, educated pt/wife on safe technique.    Exercises Total Joint Exercises Ankle Circles/Pumps: AROM, 15 reps Quad Sets: AROM, 5 reps Gluteal Sets: AROM, 5 reps Short Arc Quad: Other (comment) (1 rep for practice) Heel Slides: Both, 10 reps, Supine, AROM, AAROM Hip ABduction/ADduction: AAROM (1 rep for practice)   Assessment/Plan    PT Assessment Patient needs continued PT services  PT Problem List Decreased mobility;Decreased knowledge of use of DME;Pain;Decreased strength;Decreased range of motion;Decreased activity tolerance       PT Treatment Interventions DME instruction;Gait training;Stair training;Functional mobility training;Therapeutic activities;Therapeutic exercise;Patient/family education    PT Goals (Current goals can be found in the Care Plan section)  Acute Rehab PT Goals Patient Stated Goal: Back independent PT Goal Formulation: With patient Time For Goal Achievement: 09/23/21 Potential to Achieve Goals: Good    Frequency 7X/week     Co-evaluation               AM-PAC PT "6 Clicks" Mobility  Outcome Measure Help needed turning from your back to your side while in a flat bed without using bedrails?: A Little Help needed moving from lying on your back to sitting on the side of a flat bed without using bedrails?: A Little Help needed moving to and from a bed to a chair (including a  wheelchair)?: A Little Help needed standing up from a chair using your arms (e.g., wheelchair or bedside chair)?: A Little Help needed to walk in hospital room?: A Little Help needed climbing 3-5 steps with a railing? : A Little 6 Click Score: 18    End of Session   Activity Tolerance: Patient tolerated treatment well (painful) Patient left: in bed;with call bell/phone within reach;with family/visitor present Nurse Communication: Mobility status PT Visit Diagnosis: Other abnormalities of gait and mobility (R26.89);Pain Pain - Right/Left: Left Pain - part of body:  (groin)    Time: 3785-8850 PT Time Calculation (min) (ACUTE ONLY): 33 min   Charges:   PT Evaluation $PT Eval Low Complexity: 1 Low PT Treatments $Gait Training: 8-22 mins        09/16/2021  Jacinto Halim., PT Acute Rehabilitation Services [phone]  (pager) [phone]  (office)  Eliseo Gum Lenita Peregrina 09/16/2021, 5:53 PM

## 2021-09-16 NOTE — Transfer of Care (Signed)
Immediate Anesthesia Transfer of Care Note  Patient: Tim Grant  Procedure(s) Performed: LEFT TOTAL HIP ARTHROPLASTY ANTERIOR APPROACH (Left: Hip)  Patient Location: PACU  Anesthesia Type:MAC combined with regional for post-op pain  Level of Consciousness: sedated  Airway & Oxygen Therapy: Patient Spontanous Breathing  Post-op Assessment: Report given to RN and Post -op Vital signs reviewed and stable  Post vital signs: Reviewed and stable  Last Vitals:  Vitals Value Taken Time  BP    Temp    Pulse 71 09/16/21 1121  Resp 18 09/16/21 1122  SpO2 97 % 09/16/21 1121  Vitals shown include unvalidated device data.  Last Pain:  Vitals:   09/16/21 0839  TempSrc:   PainSc: 0-No pain         Complications: No notable events documented.

## 2021-09-17 ENCOUNTER — Encounter (HOSPITAL_COMMUNITY): Payer: Self-pay | Admitting: Orthopaedic Surgery

## 2021-09-17 DIAGNOSIS — M1612 Unilateral primary osteoarthritis, left hip: Secondary | ICD-10-CM | POA: Diagnosis not present

## 2021-09-17 LAB — CBC
HCT: 36.3 % — ABNORMAL LOW (ref 39.0–52.0)
Hemoglobin: 12.1 g/dL — ABNORMAL LOW (ref 13.0–17.0)
MCH: 28.8 pg (ref 26.0–34.0)
MCHC: 33.3 g/dL (ref 30.0–36.0)
MCV: 86.4 fL (ref 80.0–100.0)
Platelets: 212 10*3/uL (ref 150–400)
RBC: 4.2 MIL/uL — ABNORMAL LOW (ref 4.22–5.81)
RDW: 13.6 % (ref 11.5–15.5)
WBC: 6.8 10*3/uL (ref 4.0–10.5)
nRBC: 0 % (ref 0.0–0.2)

## 2021-09-17 LAB — BASIC METABOLIC PANEL
Anion gap: 6 (ref 5–15)
BUN: 9 mg/dL (ref 8–23)
CO2: 26 mmol/L (ref 22–32)
Calcium: 8.7 mg/dL — ABNORMAL LOW (ref 8.9–10.3)
Chloride: 102 mmol/L (ref 98–111)
Creatinine, Ser: 1.04 mg/dL (ref 0.61–1.24)
GFR, Estimated: >60 mL/min (ref >60)
Glucose, Bld: 122 mg/dL — ABNORMAL HIGH (ref 70–99)
Potassium: 4.5 mmol/L (ref 3.5–5.1)
Sodium: 134 mmol/L — ABNORMAL LOW (ref 135–145)

## 2021-09-17 MED ORDER — ASPIRIN 81 MG PO CHEW: 81.0000 mg | CHEWABLE_TABLET | Freq: Two times a day (BID) | ORAL | 0 refills | Status: DC

## 2021-09-17 MED ORDER — OXYCODONE HCL 5 MG PO TABS
5.0000 mg | ORAL_TABLET | ORAL | 0 refills | Status: DC | PRN
Start: 2021-09-17 — End: 2021-09-19

## 2021-09-17 MED ORDER — METHOCARBAMOL 500 MG PO TABS: 500.0000 mg | ORAL_TABLET | Freq: Four times a day (QID) | ORAL | 1 refills | Status: DC | PRN

## 2021-09-17 NOTE — Progress Notes (Signed)
Physical Therapy Treatment Patient Details Name: Tim Grant MRN: [id number] DOB: [birth year] Today's Date: 09/17/2021   History of Present Illness pt is a 66 y/o male admitted 5/30 for elective L THA,  having failed conservative treatments.  PMHx:HTN, OA left hip, knee arthroscopy    PT Comments    Patient progressing towards physical therapy goals. Patient ambulated 600' with supervision and use of RW. Trialed ambulating without AD but patient demonstrating limping gait pattern which improves with use of RW. Reviewed HEP handout for patient to perform at home. Patient eager to return home. D/c plan remains appropriate.     Recommendations for follow up therapy are one component of a multi-disciplinary discharge planning process, led by the attending physician.  Recommendations may be updated based on patient status, additional functional criteria and insurance authorization.  Follow Up Recommendations  Follow physician's recommendations for discharge plan and follow up therapies     Assistance Recommended at Discharge Set up Supervision/Assistance  Patient can return home with the following A little help with walking and/or transfers;A little help with bathing/dressing/bathroom;Assistance with cooking/housework;Assist for transportation;Help with stairs or ramp for entrance   Equipment Recommendations  Rolling Marilyn Nihiser (2 wheels);BSC/3in1    Recommendations for Other Services       Precautions / Restrictions Precautions Precautions: Anterior Hip Restrictions Weight Bearing Restrictions: Yes LLE Weight Bearing: Weight bearing as tolerated     Mobility  Bed Mobility Overal bed mobility: Needs Assistance Bed Mobility: Supine to Sit     Supine to sit: Min guard     General bed mobility comments: instructed patient on hooking L LE with R to assist with OOB. Min guard for safety    Transfers Overall transfer level: Needs assistance Equipment used: Rolling Suheyb Raucci (2  wheels) Transfers: Sit to/from Stand Sit to Stand: Min guard           General transfer comment: cues for hand placement. Stood from elevated surface. Practiced sitting on low bed surface with cueing for kicking leg out in front if unable to bend hip    Ambulation/Gait Ambulation/Gait assistance: Supervision Gait Distance (Feet): 600 Feet Assistive device: Rolling Allanah Mcfarland (2 wheels) Gait Pattern/deviations: Step-through pattern, Decreased stride length Gait velocity: decreased     General Gait Details: cues for heel strike on L . Trialed without AD but patient with limping gait pattern which improves with use of RW   Stairs             Wheelchair Mobility    Modified Rankin (Stroke Patients Only)       Balance Overall balance assessment: No apparent balance deficits (not formally assessed)                                          Cognition Arousal/Alertness: Awake/alert Behavior During Therapy: WFL for tasks assessed/performed Overall Cognitive Status: Within Functional Limits for tasks assessed                                          Exercises Other Exercises Other Exercises: Reviewed HEP handout for patient to perform at home    General Comments        Pertinent Vitals/Pain Pain Assessment Pain Assessment: 0-10 Pain Score: 4  Pain Location: L hip Pain Descriptors / Indicators: Aching, Discomfort,  Guarding Pain Intervention(s): Monitored during session, Repositioned    Home Living                          Prior Function            PT Goals (current goals can now be found in the care plan section) Acute Rehab PT Goals PT Goal Formulation: With patient Time For Goal Achievement: 09/23/21 Potential to Achieve Goals: Good Progress towards PT goals: Progressing toward goals    Frequency    7X/week      PT Plan Current plan remains appropriate    Co-evaluation              AM-PAC  PT "6 Clicks" Mobility   Outcome Measure  Help needed turning from your back to your side while in a flat bed without using bedrails?: A Little Help needed moving from lying on your back to sitting on the side of a flat bed without using bedrails?: A Little Help needed moving to and from a bed to a chair (including a wheelchair)?: A Little Help needed standing up from a chair using your arms (e.g., wheelchair or bedside chair)?: A Little Help needed to walk in hospital room?: A Little Help needed climbing 3-5 steps with a railing? : A Little 6 Click Score: 18    End of Session   Activity Tolerance: Patient tolerated treatment well Patient left: in bed;with call bell/phone within reach;with family/visitor present Nurse Communication: Mobility status PT Visit Diagnosis: Other abnormalities of gait and mobility (R26.89);Pain Pain - Right/Left: Left Pain - part of body: Hip     Time: [id number] PT Time Calculation (min) (ACUTE ONLY): 39 min  Charges:  $Gait Training: 23-37 mins $Therapeutic Exercise: 8-22 mins                     Neveyah Garzon A. Gilford Rile PT, DPT Acute Rehabilitation Services Pager [phone] Office [phone]    Linna Hoff 09/17/2021, 11:04 AM

## 2021-09-17 NOTE — TOC Transition Note (Signed)
Transition of Care [facility]) - CM/SW Discharge Note   Patient Details  Name: Tim Grant MRN: [id number] Date of Birth: [birth year]  Transition of Care [facility]) CM/SW Contact:  Tom-Johnson, Hershal Coria, RN Phone Number: 09/17/2021, 11:50 AM   Clinical Narrative:     Patient is scheduled for discharge today. Home health PT referral scheduled at MD's office with [facility]. CM called and spoke with Brandi to verify. Info on AVS. RW and BSC given to patient at bedside by nursing staff.  Family to transport at discharge. No further TOC needs noted.    Final next level of care: Home w Home Health Services Barriers to Discharge: Barriers Resolved   Patient Goals and CMS Choice Patient states their goals for this hospitalization and ongoing recovery are:: To return home CMS Medicare.gov Compare Post Acute Care list provided to:: Patient Choice offered to / list presented to : Patient  Discharge Placement                Patient to be transferred to facility by: Family      Discharge Plan and Services                DME Arranged: 3-N-1, Walker rolling DME Agency: [facility] Date DME Agency Contacted: 09/17/21 Time DME Agency Contacted: 1030 Representative spoke with at DME Agency: Arnold Long HH Arranged: PT HH Agency: [facility] Date [facility] Agency Contacted: 09/17/21 Time HH Agency Contacted: 1000 Representative spoke with at [facility] Agency: Brandi  Social Determinants of Health (SDOH) Interventions     Readmission Risk Interventions     View : No data to display.

## 2021-09-17 NOTE — Discharge Summary (Signed)
Patient ID: Tim Grant MRN: [id number] DOB/AGE: 22-Jan-1956 66 y.o.  Admit date: 09/16/2021 Discharge date: 09/17/2021  Admission Diagnoses:  Principal Problem:   Unilateral primary osteoarthritis, left hip Active Problems:   Status post left hip replacement   Discharge Diagnoses:  Same  Past Medical History:  Diagnosis Date   Arthritis    Hypertension     Surgeries: Procedure(s): LEFT TOTAL HIP ARTHROPLASTY ANTERIOR APPROACH on 09/16/2021   Consultants:   Discharged Condition: Improved  Hospital Course: Tim Grant is an 66 y.o. male who was admitted 09/16/2021 for operative treatment ofUnilateral primary osteoarthritis, left hip. Patient has severe unremitting pain that affects sleep, daily activities, and work/hobbies. After pre-op clearance the patient was taken to the operating room on 09/16/2021 and underwent  Procedure(s): LEFT TOTAL HIP ARTHROPLASTY ANTERIOR APPROACH.    Patient was given perioperative antibiotics:  Anti-infectives (From admission, onward)    Start     Dose/Rate Route Frequency Ordered Stop   09/16/21 1600  ceFAZolin (ANCEF) IVPB 1 g/50 mL premix        1 g 100 mL/hr over 30 Minutes Intravenous Every 6 hours 09/16/21 1358 09/16/21 2102   09/16/21 0830  ceFAZolin (ANCEF) IVPB 2g/100 mL premix        2 g 200 mL/hr over 30 Minutes Intravenous On call to O.R. 09/16/21 0820 09/16/21 0955        Patient was given sequential compression devices, early ambulation, and chemoprophylaxis to prevent DVT.  Patient benefited maximally from hospital stay and there were no complications.    Recent vital signs: Patient Vitals for the past 24 hrs:  BP Temp Temp src Pulse Resp SpO2 Height Weight  09/17/21 0734 124/81 100 F (37.8 C) Oral (!) 107 18 100 % -- --  09/17/21 0315 (!) 149/83 100.2 F (37.9 C) Oral (!) 109 18 99 % -- --  09/16/21 2309 138/80 98.5 F (36.9 C) Oral 88 18 99 % -- --  09/16/21 1921 (!) 178/90 99 F (37.2 C) Oral 70 20  100 % -- --  09/16/21 1623 (!) 164/86 98.2 F (36.8 C) Oral (!) 54 18 100 % -- --  09/16/21 1353 (!) 152/83 97.8 F (36.6 C) -- (!) 42 18 100 % -- --  09/16/21 1320 137/73 (!) 97 F (36.1 C) -- (!) 39 16 100 % -- --  09/16/21 1315 -- -- -- (!) 41 15 100 % -- --  09/16/21 1305 119/69 -- -- (!) 38 16 100 % -- --  09/16/21 1245 -- -- -- (!) 40 15 99 % -- --  09/16/21 1235 102/61 -- -- (!) 40 16 100 % -- --  09/16/21 1215 101/63 -- -- (!) 47 19 97 % -- --  09/16/21 1205 (!) 94/57 -- -- (!) 36 18 100 % -- --  09/16/21 1200 95/62 -- -- (!) 51 (!) 21 98 % -- --  09/16/21 1155 95/62 -- -- 88 14 100 % -- --  09/16/21 1140 90/60 -- -- (!) 53 15 99 % -- --  09/16/21 1135 (!) 76/59 -- -- (!) 53 15 100 % -- --  09/16/21 1125 (!) 90/57 98.1 F (36.7 C) -- (!) 57 18 100 % -- --  09/16/21 0815 (!) 153/90 98.8 F (37.1 C) Oral 80 18 100 % 5\' 9"  (1.753 m) 95.3 kg     Recent laboratory studies:  Recent Labs    09/17/21 0524  WBC 6.8  HGB 12.1*  HCT 36.3*  PLT  212  NA 134*  K 4.5  CL 102  CO2 26  BUN 9  CREATININE 1.04  GLUCOSE 122*  CALCIUM 8.7*     Discharge Medications:   Allergies as of 09/17/2021   No Known Allergies      Medication List     STOP taking these medications    HYDROcodone-acetaminophen 5-325 MG tablet Commonly known as: NORCO/VICODIN       TAKE these medications    acetaminophen 500 MG tablet Commonly known as: TYLENOL Take 1,000 mg by mouth every 6 (six) hours as needed (for pain.).   aspirin 81 MG chewable tablet Chew 1 tablet (81 mg total) by mouth 2 (two) times daily.   BENGAY EX Apply 1 application. topically 3 (three) times daily as needed (leg/hip pain.).   CoQ10 200 MG Caps Take 200 mg by mouth at bedtime.   ergocalciferol 1.25 MG (50000 UT) capsule Commonly known as: VITAMIN D2 Take 50,000 Units by mouth every Tuesday.   losartan 50 MG tablet Commonly known as: COZAAR Take 50 mg by mouth in the morning.   methocarbamol 500 MG  tablet Commonly known as: ROBAXIN Take 1 tablet (500 mg total) by mouth every 6 (six) hours as needed for muscle spasms.   multivitamin with minerals Tabs tablet Take 1 tablet by mouth in the morning. One A Day for Men 50+   oxyCODONE 5 MG immediate release tablet Commonly known as: Oxy IR/ROXICODONE Take 1-2 tablets (5-10 mg total) by mouth every 4 (four) hours as needed for moderate pain (pain score 4-6).   rosuvastatin 10 MG tablet Commonly known as: CRESTOR Take 10 mg by mouth at bedtime.               Durable Medical Equipment  (From admission, onward)           Start     Ordered   09/16/21 1359  DME Walker rolling  Once       Question Answer Comment  Walker: With 5 Inch Wheels   Patient needs a walker to treat with the following condition Status post left hip replacement      09/16/21 1358            Diagnostic Studies: DG Pelvis Portable  Result Date: 09/16/2021 CLINICAL DATA:  Left hip replacement. EXAM: PORTABLE PELVIS 1-2 VIEWS COMPARISON:  MRI left hip dated June 13, 2021. FINDINGS: The left hip demonstrates a total arthroplasty without evidence of hardware failure or complication. There is no fracture or dislocation. The alignment is anatomic. Post-surgical changes noted in the surrounding soft tissues. Unchanged mild right hip osteoarthritis. IMPRESSION: 1. Left total hip arthroplasty without acute postoperative complication. Electronically Signed   By: Obie Dredge M.D.   On: 09/16/2021 13:01   DG C-Arm 1-60 Min-No Report  Result Date: 09/16/2021 Fluoroscopy was utilized by the requesting physician.  No radiographic interpretation.   DG HIP UNILAT WITH PELVIS 1V LEFT  Result Date: 09/16/2021 CLINICAL DATA:  Left hip arthroplasty EXAM: DG HIP (WITH OR WITHOUT PELVIS) 1V*L* COMPARISON:  Hip radiograph 05/13/2021 FINDINGS: Intraoperative images during left hip arthroplasty. Normal alignment without evidence of loosening or periprosthetic  fracture. IMPRESSION: Intraoperative images during left hip arthroplasty. Normal alignment. No evidence of immediate complication. Electronically Signed   By: Caprice Renshaw M.D.   On: 09/16/2021 11:11    Disposition: Discharge disposition: 01-Home or Self Care          Follow-up Information     Doneen Poisson  Y, MD Follow up in 2 week(s).   Specialty: Orthopedic Surgery Contact information: [address] [place] [address] [phone]                  Signed: Kathryne HitchChristopher Y Latonyia Lopata 09/17/2021, 8:00 AM

## 2021-09-17 NOTE — Discharge Instructions (Signed)

## 2021-09-17 NOTE — Plan of Care (Signed)
Pt given D/C instructions with verbal understanding. Rx's were sent to the pharmacy by MD. Pt's incision is clean and dry with no sign of infection. Pt's IV was removed prior to D/C. Pt received RW from [employer] prior to D/C. Pt D/C'd home via wheelchair per MD order. Pt is stable @ D/C and has no other needs at this time. Rema Fendt, RN

## 2021-09-17 NOTE — Progress Notes (Signed)
Subjective: 1 Day Post-Op Procedure(s) (LRB): LEFT TOTAL HIP ARTHROPLASTY ANTERIOR APPROACH (Left) Patient reports pain as moderate.    Objective: Vital signs in last 24 hours: Temp:  [97 F (36.1 C)-100.2 F (37.9 C)] 100 F (37.8 C) (05/31 0734) Pulse Rate:  [36-109] 107 (05/31 0734) Resp:  [14-21] 18 (05/31 0734) BP: (76-178)/(57-90) 124/81 (05/31 0734) SpO2:  [97 %-100 %] 100 % (05/31 0734) Weight:  [95.3 kg] 95.3 kg (05/30 0815)  Intake/Output from previous day: 05/30 0701 - 05/31 0700 In: 1800 [P.O.:500; I.V.:1200; IV Piggyback:100] Out: 1050 [Urine:850; Blood:200] Intake/Output this shift: No intake/output data recorded.  Recent Labs    09/17/21 0524  HGB 12.1*   Recent Labs    09/17/21 0524  WBC 6.8  RBC 4.20*  HCT 36.3*  PLT 212   Recent Labs    09/17/21 0524  NA 134*  K 4.5  CL 102  CO2 26  BUN 9  CREATININE 1.04  GLUCOSE 122*  CALCIUM 8.7*   No results for input(s): LABPT, INR in the last 72 hours.  Sensation intact distally Intact pulses distally Dorsiflexion/Plantar flexion intact Incision: scant drainage   Assessment/Plan: 1 Day Post-Op Procedure(s) (LRB): LEFT TOTAL HIP ARTHROPLASTY ANTERIOR APPROACH (Left) Up with therapy Discharge home with home health      Kathryne Hitch 09/17/2021, 7:58 AM

## 2021-09-19 ENCOUNTER — Other Ambulatory Visit: Payer: Self-pay | Admitting: Orthopaedic Surgery

## 2021-09-22 ENCOUNTER — Other Ambulatory Visit: Payer: Self-pay | Admitting: Orthopaedic Surgery

## 2021-09-22 MED ORDER — OXYCODONE HCL 5 MG PO TABS: 5.0000 mg | ORAL_TABLET | ORAL | 0 refills | Status: DC | PRN

## 2021-09-22 NOTE — Telephone Encounter (Signed)
Last ordered by Jean Rosenthal, MD. Request refills from the same.

## 2021-09-24 ENCOUNTER — Other Ambulatory Visit: Payer: Self-pay | Admitting: Orthopaedic Surgery

## 2021-09-24 ENCOUNTER — Telehealth: Payer: Self-pay | Admitting: Orthopaedic Surgery

## 2021-09-24 MED ORDER — OXYCODONE-ACETAMINOPHEN 5-325 MG PO TABS: 1.0000 | ORAL_TABLET | Freq: Four times a day (QID) | ORAL | 0 refills | Status: DC | PRN

## 2021-09-24 NOTE — Telephone Encounter (Signed)
Pt called and states that his pharm does not have oxy 5s but the pharm said they have 10s. Can we change the mg?   CB [phone]

## 2021-09-24 NOTE — Telephone Encounter (Signed)
Please advise 

## 2021-09-30 ENCOUNTER — Encounter: Payer: 59 | Admitting: Orthopaedic Surgery

## 2021-10-02 ENCOUNTER — Ambulatory Visit (INDEPENDENT_AMBULATORY_CARE_PROVIDER_SITE_OTHER): Payer: 59 | Admitting: Orthopaedic Surgery

## 2021-10-02 ENCOUNTER — Encounter: Payer: Self-pay | Admitting: Orthopaedic Surgery

## 2021-10-02 DIAGNOSIS — Z96642 Presence of left artificial hip joint: Secondary | ICD-10-CM

## 2021-10-02 MED ORDER — METHOCARBAMOL 500 MG PO TABS: 500.0000 mg | ORAL_TABLET | Freq: Four times a day (QID) | ORAL | 1 refills | Status: AC | PRN

## 2021-10-02 MED ORDER — OXYCODONE-ACETAMINOPHEN 5-325 MG PO TABS
1.0000 | ORAL_TABLET | Freq: Four times a day (QID) | ORAL | 0 refills | Status: AC | PRN
Start: 2021-10-02 — End: ?

## 2021-10-02 NOTE — Progress Notes (Signed)
The patient is 2 weeks tomorrow status post a left total hip arthroplasty.  He is 66 years old and active.  He is walking with a walking stick.  He says he is doing well but he does need a refill of his pain medication.  He did actually drive today as well but not on pain medications.  His left hip incision looks good.  The staples are removed and Steri-Strips applied.  When I assess his leg lengths he is just slightly shorter on the left operative side and the right side but he said he does not notice that because he had such a bad plan before from his about arthritis and he was significantly short before.  He will continue to increase his activities as comfort allows.  I will send in more pain medication as a muscle relaxant.  He can stop his aspirin.  I will see him back in 4 weeks to see how he is doing overall but no x-rays are needed.

## 2021-10-03 ENCOUNTER — Other Ambulatory Visit: Payer: Self-pay

## 2021-10-09 ENCOUNTER — Other Ambulatory Visit: Payer: Self-pay | Admitting: Orthopaedic Surgery

## 2021-10-30 ENCOUNTER — Ambulatory Visit (INDEPENDENT_AMBULATORY_CARE_PROVIDER_SITE_OTHER): Payer: 59 | Admitting: Orthopaedic Surgery

## 2021-10-30 ENCOUNTER — Encounter: Payer: Self-pay | Admitting: Orthopaedic Surgery

## 2021-10-30 DIAGNOSIS — Z96642 Presence of left artificial hip joint: Secondary | ICD-10-CM

## 2021-10-30 MED ORDER — TIZANIDINE HCL 4 MG PO TABS: 4.0000 mg | ORAL_TABLET | Freq: Three times a day (TID) | ORAL | 0 refills | Status: DC | PRN

## 2021-10-30 NOTE — Progress Notes (Signed)
The patient comes in today about 6 weeks out from a left total hip arthroplasty.  He says he is doing well overall but is just sore at night.  He reports improved range of motion and strength.  He is walking without assistive device.  He has just a slight limp.  His left operative hip is still slightly stiff with internal and external rotation but much better than it was preoperative.  He understands that this will continue to improve the further he gets out from surgery.  He had severe arthritis in that hip.  His right hip smoothly.  There is a little bit of stiffness with rotation of the right hip.  He would like to try just a different muscle relaxant at bedtime.  I will send in some Zanaflex.  I would like to see him back in 6 weeks to see how he is doing overall but no x-rays are needed.  If he looks good at that visit we will see him back for 6 months.

## 2021-11-19 ENCOUNTER — Telehealth: Payer: Self-pay | Admitting: Orthopaedic Surgery

## 2021-11-19 NOTE — Telephone Encounter (Signed)
Is this ok?

## 2021-11-19 NOTE — Telephone Encounter (Signed)
Patient called back asking how to access the letter through his mychart. I tried to help him over the phone but he said he couldn't find it. CB # [phone] Patient said if he cant figure out how to view on mychart he will just come in the office to pick up the letter.

## 2021-11-19 NOTE — Telephone Encounter (Signed)
Sami Production designer, theatre/television/film) from [employer] received a light duty letter from Dr. Magnus Ivan. Sami is asking for a call back with what restrictions per can not do. Please call Smai at [phone].

## 2021-11-19 NOTE — Telephone Encounter (Signed)
Please advise 

## 2021-11-19 NOTE — Telephone Encounter (Signed)
Completed note and sent it to mychart. Lvm for pt advising him

## 2021-11-19 NOTE — Telephone Encounter (Signed)
Work note updated and sent to [employer]. 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

## 2021-11-19 NOTE — Telephone Encounter (Signed)
Patient called requesting a return to work note to go back on light duty. Patient said they would like to go back as soon as possible. Please call once ready to pick up CB # [phone]

## 2021-12-10 ENCOUNTER — Ambulatory Visit (INDEPENDENT_AMBULATORY_CARE_PROVIDER_SITE_OTHER): Payer: 59 | Admitting: Orthopaedic Surgery

## 2021-12-10 ENCOUNTER — Encounter: Payer: Self-pay | Admitting: Orthopaedic Surgery

## 2021-12-10 VITALS — BP 147/89

## 2021-12-10 DIAGNOSIS — M7062 Trochanteric bursitis, left hip: Secondary | ICD-10-CM | POA: Diagnosis not present

## 2021-12-10 DIAGNOSIS — Z96642 Presence of left artificial hip joint: Secondary | ICD-10-CM

## 2021-12-10 MED ORDER — LIDOCAINE HCL 1 % IJ SOLN
3.0000 mL | INTRAMUSCULAR | Status: AC | PRN
  Administered 2021-12-10: 3 mL

## 2021-12-10 MED ORDER — METHYLPREDNISOLONE ACETATE 40 MG/ML IJ SUSP
40.0000 mg | INTRAMUSCULAR | Status: AC | PRN
  Administered 2021-12-10: 40 mg via INTRA_ARTICULAR

## 2021-12-10 NOTE — Progress Notes (Signed)
   Procedure Note  Patient: Tim Grant             Date of Birth: 05-04-1955           MRN: [id number]             Visit Date: 12/10/2021  HPI: Mr. Tim Grant returns today status post left total hip arthroplasty 09/16/2021.  States he is having pain lateral aspect of his hip no numbness tingling.  Pain radiates down the lateral aspect of the knee.  Does limp occasionally due to pain.  He states that overall the hip is doing well.  Physical exam: General well-developed well-nourished male no acute distress ambulates without any assistive device. Left hip stiffness with external and internal rotation.  He has tenderness over the left trochanteric region and down the IT band.  Surgical incisions well-healed.   Procedures: Visit Diagnoses:  1. Trochanteric bursitis, left hip   2. Status post total replacement of left hip     Large Joint Inj: L greater trochanter on 12/10/2021 10:37 AM Indications: pain Details: 22 G 1.5 in needle, lateral approach  Arthrogram: No  Medications: 3 mL lidocaine 1 %; 40 mg methylPREDNISolone acetate 40 MG/ML Outcome: tolerated well, no immediate complications Procedure, treatment alternatives, risks and benefits explained, specific risks discussed. Consent was given by the patient. Immediately prior to procedure a time out was called to verify the correct patient, procedure, equipment, support staff and site/side marked as required. Patient was prepped and draped in the usual sterile fashion.     Plan: Per his request he is given a note to return to work full duties without restrictions on December 15, 2021.  He is shown IT band stretching exercises.  We will see him back in 6 months for AP pelvis and lateral left hip radiographs.  He will follow-up sooner if there is any questions concerns.  Questions were encouraged and answered

## 2022-03-31 DIAGNOSIS — H52223 Regular astigmatism, bilateral: Secondary | ICD-10-CM | POA: Diagnosis not present

## 2022-03-31 DIAGNOSIS — H524 Presbyopia: Secondary | ICD-10-CM | POA: Diagnosis not present

## 2022-03-31 DIAGNOSIS — H5203 Hypermetropia, bilateral: Secondary | ICD-10-CM | POA: Diagnosis not present

## 2022-06-18 ENCOUNTER — Encounter: Payer: Self-pay | Admitting: Radiology

## 2022-06-25 ENCOUNTER — Encounter: Payer: Self-pay | Admitting: Radiology

## 2022-10-29 ENCOUNTER — Encounter (HOSPITAL_COMMUNITY): Payer: Self-pay | Admitting: Emergency Medicine

## 2022-10-29 ENCOUNTER — Emergency Department (HOSPITAL_COMMUNITY): Payer: Medicare HMO

## 2022-10-29 ENCOUNTER — Other Ambulatory Visit: Payer: Self-pay

## 2022-10-29 ENCOUNTER — Ambulatory Visit: Payer: Self-pay

## 2022-10-29 ENCOUNTER — Emergency Department (HOSPITAL_COMMUNITY)
Admission: EM | Admit: 2022-10-29 | Discharge: 2022-10-29 | Disposition: A | Discharge status: Home / Self Care | Payer: Medicare HMO | Attending: Student | Admitting: Student

## 2022-10-29 DIAGNOSIS — R519 Headache, unspecified: Secondary | ICD-10-CM | POA: Insufficient documentation

## 2022-10-29 DIAGNOSIS — Z7982 Long term (current) use of aspirin: Secondary | ICD-10-CM | POA: Diagnosis not present

## 2022-10-29 DIAGNOSIS — H538 Other visual disturbances: Secondary | ICD-10-CM | POA: Diagnosis not present

## 2022-10-29 MED ORDER — METOCLOPRAMIDE HCL 5 MG/ML IJ SOLN
10.0000 mg | Freq: Once | INTRAMUSCULAR | Status: AC
  Administered 2022-10-29: 10 mg via INTRAVENOUS
  Filled 2022-10-29: qty 2

## 2022-10-29 MED ORDER — DEXAMETHASONE SODIUM PHOSPHATE 10 MG/ML IJ SOLN
10.0000 mg | Freq: Once | INTRAMUSCULAR | Status: AC
  Administered 2022-10-29: 10 mg via INTRAVENOUS
  Filled 2022-10-29: qty 1

## 2022-10-29 MED ORDER — DIPHENHYDRAMINE HCL 50 MG/ML IJ SOLN
25.0000 mg | Freq: Once | INTRAMUSCULAR | Status: AC
  Administered 2022-10-29: 25 mg via INTRAVENOUS
  Filled 2022-10-29: qty 1

## 2022-10-29 NOTE — Discharge Instructions (Addendum)
Is a pleasure taking care of you today.  Your CT scan was reassuring.  You had some chronic changes but no acute findings.  We feel that your symptoms of wavy lines in your vision just prior to headache is related to migraine.  You are treated for migraine today  Follow-up with your primary care doctor and neurology.  Come back to the ER for any worsening symptoms.

## 2022-10-29 NOTE — ED Triage Notes (Signed)
Pt c/o of visual changes x 3days that comes and go's. Pt states he has a headache following the vision changes. Also c/o breaking out into sweats.

## 2022-10-29 NOTE — ED Provider Notes (Signed)
[place] EMERGENCY DEPARTMENT AT [facility] Provider Note   CSN: [id number] Arrival date & time: 10/29/22  1551     History  Chief Complaint  Patient presents with   Visual Field Change    Tim Grant is a 67 y.o. male.  Past medical history of peripheral vascular disease.  He presents the ER today complaining of intermittent blurry vision described as having waves in his vision and his vision being blurry.  Is been going on for 3 days.  He states that it last for about 30 minutes and then he gets a frontal headache.  The mild wavy lines are present in both eyes simultaneously when this happens.    He states the headache gets better when he puts pressure to his temples bilaterally.  He has been having headaches several times a day and having the vision changes for about 1 time per day for the past 3 days.  He is having mild headache now but no vision changes at the time of my exam.  He denies vision loss.  Denies any floaters or flashers.    The lines are still present when he closes his eyes.  No recent head trauma.  No vomiting or nausea.  He states he had a similar episode several years ago that resolved and never came back.  Is any numbness tingling or weakness or speech changes when this happens, no trouble with ambulation.  He is not on blood thinners.  He states he called his primary care doctor today and they advised to come to the ER to get a CT scan.   Patient also requesting that look in his ears, he states he watched a TV show recently where somebody had a bug in their ear and wants me to make sure there are no insects in his ears. HPI     Home Medications Prior to Admission medications   Medication Sig Start Date End Date Taking? Authorizing Provider  acetaminophen (TYLENOL) 500 MG tablet Take 1,000 mg by mouth every 6 (six) hours as needed (for pain.).    [provider]  aspirin 81 MG chewable tablet CHEW 1 TABLET (81 MG TOTAL) BY MOUTH 2  (TWO) TIMES DAILY. 10/09/21   Kathryne Hitch, MD  Coenzyme Q10 (COQ10) 200 MG CAPS Take 200 mg by mouth at bedtime.    [provider]  ergocalciferol (VITAMIN D2) 1.25 MG (50000 UT) capsule Take 50,000 Units by mouth every Tuesday.    [provider]  losartan (COZAAR) 50 MG tablet Take 50 mg by mouth in the morning.    [provider]  Menthol, Topical Analgesic, (BENGAY EX) Apply 1 application. topically 3 (three) times daily as needed (leg/hip pain.).    [provider]  methocarbamol (ROBAXIN) 500 MG tablet Take 1 tablet (500 mg total) by mouth every 6 (six) hours as needed for muscle spasms. 10/02/21   Kathryne Hitch, MD  Multiple Vitamin (MULTIVITAMIN WITH MINERALS) TABS tablet Take 1 tablet by mouth in the morning. One A Day for Men 50+    [provider]  oxyCODONE-acetaminophen (PERCOCET/ROXICET) 5-325 MG tablet Take 1-2 tablets by mouth every 6 (six) hours as needed for severe pain. 10/02/21   Kathryne Hitch, MD  rosuvastatin (CRESTOR) 10 MG tablet Take 10 mg by mouth at bedtime.    [provider]  tiZANidine (ZANAFLEX) 4 MG tablet Take 1 tablet (4 mg total) by mouth every 8 (eight) hours as needed for  muscle spasms. 10/30/21   Kathryne Hitch, MD      Allergies    Patient has no known allergies.    Review of Systems   Review of Systems  Physical Exam Updated Vital Signs BP (!) 148/89   Pulse (!) 59   Temp 98 F (36.7 C) (Oral)   Resp 16   Ht 5\' 9"  (1.753 m)   Wt 95 kg   SpO2 97%   BMI 30.93 kg/m  Physical Exam Vitals and nursing note reviewed.  Constitutional:      General: He is not in acute distress.    Appearance: He is well-developed.  HENT:     Head: Normocephalic and atraumatic.     Right Ear: Tympanic membrane normal.     Left Ear: Tympanic membrane normal.     Nose: Nose normal.     Mouth/Throat:     Mouth: Mucous membranes are moist.  Eyes:     General: No visual field  deficit.    Extraocular Movements: Extraocular movements intact.     Conjunctiva/sclera: Conjunctivae normal.     Pupils: Pupils are equal, round, and reactive to light.  Cardiovascular:     Rate and Rhythm: Normal rate and regular rhythm.     Heart sounds: No murmur heard. Pulmonary:     Effort: Pulmonary effort is normal. No respiratory distress.     Breath sounds: Normal breath sounds.  Abdominal:     Palpations: Abdomen is soft.     Tenderness: There is no abdominal tenderness.  Musculoskeletal:        General: No swelling or tenderness. Normal range of motion.     Cervical back: Neck supple.  Skin:    General: Skin is warm and dry.     Capillary Refill: Capillary refill takes less than 2 seconds.  Neurological:     General: No focal deficit present.     Mental Status: He is alert and oriented to person, place, and time.     GCS: GCS eye subscore is 4. GCS verbal subscore is 5. GCS motor subscore is 6.     Cranial Nerves: No dysarthria or facial asymmetry.     Sensory: Sensation is intact. No sensory deficit.     Motor: No weakness or pronator drift.     Coordination: Romberg sign negative. Finger-Nose-Finger Test and Heel to [place] Test normal.     Gait: Gait is intact.  Psychiatric:        Mood and Affect: Mood normal.     ED Results / Procedures / Treatments   Labs (all labs ordered are listed, but only abnormal results are displayed) Labs Reviewed - No data to display  EKG None  Radiology CT Head Wo Contrast  Result Date: 10/29/2022 CLINICAL DATA:  Headache, increasing frequency or severity EXAM: CT HEAD WITHOUT CONTRAST TECHNIQUE: Contiguous axial images were obtained from the base of the skull through the vertex without intravenous contrast. RADIATION DOSE REDUCTION: This exam was performed according to the departmental dose-optimization program which includes automated exposure control, adjustment of the mA and/or kV according to patient size and/or use of  iterative reconstruction technique. COMPARISON:  CT head 03/26/2016. FINDINGS: Brain: No evidence of acute infarction, hemorrhage, hydrocephalus, extra-axial collection or mass lesion/mass effect. Patchy white matter hypodensities, nonspecific but compatible with chronic microvascular ischemic disease. Vascular: No hyperdense vessel. Skull: No acute fracture. Sinuses/Orbits: Clear sinuses.  No acute orbital findings. Other: No mastoid effusions. IMPRESSION: No evidence of acute intracranial abnormality.  Electronically Signed   By: Feliberto Harts M.D.   On: 10/29/2022 19:34    Procedures Procedures    Medications Ordered in ED Medications  diphenhydrAMINE (BENADRYL) injection 25 mg (25 mg Intravenous Given 10/29/22 1946)  metoCLOPramide (REGLAN) injection 10 mg (10 mg Intravenous Given 10/29/22 1946)  dexamethasone (DECADRON) injection 10 mg (10 mg Intravenous Given 10/29/22 2031)    ED Course/ Medical Decision Making/ A&P                             Medical Decision Making This patient presents to the ED for concern of intermittent wavy lines in his vision with intermittent headaches over the past 3 days, this involves an extensive number of treatment options, and is a complaint that carries with it a high risk of complications and morbidity.  The differential diagnosis includes pneumothorax, ocular migraine, stroke, retinal detachment, vitreous detachment, vitreous hemorrhage, intracranial mass, other   Co morbidities that complicate the patient evaluation  Peripheral vascular disease, hypertension   Additional history obtained:  Additional history obtained from EMR External records from outside source obtained and reviewed including notes    Imaging Studies ordered:  I ordered imaging studies including CT head I independently visualized and interpreted imaging which showed hemorrhage or acute CVA, findings consistent with chronic microvascular ischemia I agree with the  radiologist interpretation     Problem List / ED Course / Critical interventions / Medication management  Patient presenting with intermittent episodes of wavy lines in her vision followed by a headache intermittent for the past 3 days, states the vision episodes only happen once a day but headaches happen a couple times a day, episodes only last about 30 minutes.  He had this happen many years ago and it had resolved and never came back until now.  He has no blurry vision at this time, his visual fields are intact on exam he has no neurologic findings on exam and has no other neurosymptoms when these episodes happen such as numbness, tingling, weakness, dysarthria or any other symptoms.  The wavy lines are still persistent when he closes eyes when this happens.  It is binocular.  His neuroimaging was negative for any acute findings.  Feel this is consistent with likely migraine with aura.  He was given a migraine cocktail and this resolved with a residual headache he was having when he came in today.  Given dexamethasone to prevent rebound headache.  Discussed with patient reassuring findings and exam but should have follow-up with neurology.  He was given strict return precautions. I ordered medication including Reglan, Benadryl and dexamethasone for headache Reevaluation of the patient after these medicines showed that the patient resolved I have reviewed the patients home medicines and have made adjustments as needed      Amount and/or Complexity of Data Reviewed Radiology: ordered.  Risk Prescription drug management.           Final Clinical Impression(s) / ED Diagnoses Final diagnoses:  Nonintractable episodic headache, unspecified headache type    Rx / DC Orders ED Discharge Orders     None         Ma Rings, PA-C 10/29/22 2258    Loetta Rough, MD 10/29/22 [phone]

## 2022-11-02 ENCOUNTER — Ambulatory Visit: Payer: Medicare HMO | Admitting: Physician Assistant

## 2022-11-02 ENCOUNTER — Other Ambulatory Visit: Payer: Self-pay

## 2022-11-02 ENCOUNTER — Encounter: Payer: Self-pay | Admitting: Physician Assistant

## 2022-11-02 DIAGNOSIS — Z96642 Presence of left artificial hip joint: Secondary | ICD-10-CM

## 2022-11-02 DIAGNOSIS — M25562 Pain in left knee: Secondary | ICD-10-CM

## 2022-11-02 NOTE — Progress Notes (Signed)
HPI: Tim Grant returns today due to left knee swelling and pain.  States that a week ago he woke up with some swelling in his knee but now he is having no pain in the knee or swelling.  No known injury to the left knee.  However he does state that he gets down on his knees a lot at his job.  He still has some decreased range of motion of his left hip.  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  Review of systems: Denies fevers chills  Physical exam: General: Well-developed well-nourished no acute distress. Bilateral lower extremities right hip full range of motion without pain.  Left hip slight limited internal/external rotation.  Fluid movement of both hips otherwise without pain.  Quad atrophy left leg compared to right.  Full range of motion bilateral knees no abnormal warmth erythema or effusion of either knee.  No instability valgus varus stressing McMurray's is negative bilaterally.  Left knee with patellofemoral crepitus passive range of motion.  Radiographs: AP pelvis lateral view of the left hip: Bilateral hips well located.  No acute fractures or acute findings.  Status post left total hip arthroplasty with well-seated components.  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  Follow-up with [place] for the knee if he has any swelling or pain.  Regards to his hip he is follow-up as needed.  Questions were encouraged and answered.  Radiographs reviewed with patient

## 2023-07-19 DIAGNOSIS — R7309 Other abnormal glucose: Secondary | ICD-10-CM | POA: Diagnosis not present

## 2023-07-19 DIAGNOSIS — E785 Hyperlipidemia, unspecified: Secondary | ICD-10-CM | POA: Diagnosis not present

## 2023-07-19 DIAGNOSIS — E559 Vitamin D deficiency, unspecified: Secondary | ICD-10-CM | POA: Diagnosis not present

## 2023-07-19 DIAGNOSIS — I10 Essential (primary) hypertension: Secondary | ICD-10-CM | POA: Diagnosis not present

## 2023-07-19 DIAGNOSIS — Z0001 Encounter for general adult medical examination with abnormal findings: Secondary | ICD-10-CM | POA: Diagnosis not present

## 2023-09-20 DIAGNOSIS — B029 Zoster without complications: Secondary | ICD-10-CM | POA: Diagnosis not present

## 2023-09-20 DIAGNOSIS — J302 Other seasonal allergic rhinitis: Secondary | ICD-10-CM | POA: Diagnosis not present

## 2023-09-27 ENCOUNTER — Ambulatory Visit: Admitting: Podiatry

## 2023-10-01 ENCOUNTER — Ambulatory Visit: Payer: Self-pay | Admitting: Podiatry

## 2023-12-10 ENCOUNTER — Encounter: Payer: Self-pay | Admitting: Radiology

## 2024-01-27 ENCOUNTER — Ambulatory Visit: Admitting: Podiatry

## 2024-02-19 ENCOUNTER — Emergency Department (HOSPITAL_COMMUNITY)

## 2024-02-19 ENCOUNTER — Encounter (HOSPITAL_COMMUNITY): Payer: Self-pay | Admitting: Emergency Medicine

## 2024-02-19 ENCOUNTER — Emergency Department (HOSPITAL_COMMUNITY)
Admission: EM | Admit: 2024-02-19 | Discharge: 2024-02-19 | Disposition: A | Discharge status: Home / Self Care | Attending: Emergency Medicine | Admitting: Emergency Medicine

## 2024-02-19 ENCOUNTER — Other Ambulatory Visit: Payer: Self-pay

## 2024-02-19 DIAGNOSIS — S39012A Strain of muscle, fascia and tendon of lower back, initial encounter: Secondary | ICD-10-CM | POA: Diagnosis not present

## 2024-02-19 DIAGNOSIS — X501XXA Overexertion from prolonged static or awkward postures, initial encounter: Secondary | ICD-10-CM | POA: Diagnosis not present

## 2024-02-19 DIAGNOSIS — Y92007 Garden or yard of unspecified non-institutional (private) residence as the place of occurrence of the external cause: Secondary | ICD-10-CM | POA: Diagnosis not present

## 2024-02-19 DIAGNOSIS — M5416 Radiculopathy, lumbar region: Secondary | ICD-10-CM | POA: Insufficient documentation

## 2024-02-19 DIAGNOSIS — Y93H2 Activity, gardening and landscaping: Secondary | ICD-10-CM | POA: Diagnosis not present

## 2024-02-19 DIAGNOSIS — S3992XA Unspecified injury of lower back, initial encounter: Secondary | ICD-10-CM | POA: Diagnosis present

## 2024-02-19 DIAGNOSIS — Z7982 Long term (current) use of aspirin: Secondary | ICD-10-CM | POA: Diagnosis not present

## 2024-02-19 MED ORDER — METHOCARBAMOL 500 MG PO TABS
500.0000 mg | ORAL_TABLET | Freq: Once | ORAL | Status: AC
  Administered 2024-02-19: 500 mg via ORAL
  Filled 2024-02-19: qty 1

## 2024-02-19 MED ORDER — TIZANIDINE HCL 4 MG PO TABS: 4.0000 mg | ORAL_TABLET | Freq: Three times a day (TID) | ORAL | 0 refills | Status: AC | PRN

## 2024-02-19 MED ORDER — NAPROXEN 250 MG PO TABS
500.0000 mg | ORAL_TABLET | Freq: Once | ORAL | Status: AC
  Administered 2024-02-19: 500 mg via ORAL
  Filled 2024-02-19: qty 2

## 2024-02-19 MED ORDER — NAPROXEN 375 MG PO TABS
375.0000 mg | ORAL_TABLET | Freq: Two times a day (BID) | ORAL | 0 refills | Status: AC
Start: 2024-02-19 — End: ?

## 2024-02-19 MED ORDER — ACETAMINOPHEN 500 MG PO TABS
1000.0000 mg | ORAL_TABLET | Freq: Once | ORAL | Status: AC
  Administered 2024-02-19: 1000 mg via ORAL
  Filled 2024-02-19: qty 2

## 2024-02-19 MED ORDER — LIDOCAINE 5 % EX PTCH
1.0000 | MEDICATED_PATCH | CUTANEOUS | Status: DC
  Administered 2024-02-19: 1 via TRANSDERMAL
  Filled 2024-02-19: qty 1

## 2024-02-19 MED ORDER — LIDOCAINE 4 % EX PTCH: 1.0000 | MEDICATED_PATCH | Freq: Two times a day (BID) | CUTANEOUS | 0 refills | Status: AC

## 2024-02-19 MED ORDER — ACETAMINOPHEN 500 MG PO TABS: 500.0000 mg | ORAL_TABLET | Freq: Four times a day (QID) | ORAL | 0 refills | Status: AC | PRN

## 2024-02-19 NOTE — ED Provider Notes (Signed)
 [name]   CSN: [id number] Arrival date & time: 02/19/24  1038     Patient presents with: Back Pain   Tim Grant is a 68 y.o. male.  {Add pertinent medical, surgical, social history, OB history to HPI:32942} HPI    67 year old male comes in with chief complaint of back pain  Prior to Admission medications   Medication Sig Start Date End Date Taking? Authorizing Provider  acetaminophen  (TYLENOL ) 500 MG tablet Take 1,000 mg by mouth every 6 (six) hours as needed (for pain.).    [provider]  aspirin  81 MG chewable tablet CHEW 1 TABLET (81 MG TOTAL) BY MOUTH 2 (TWO) TIMES DAILY. 10/09/21   Vernetta Lonni GRADE, MD  Coenzyme Q10 (COQ10) 200 MG CAPS Take 200 mg by mouth at bedtime.    [provider]  ergocalciferol (VITAMIN D2) 1.25 MG (50000 UT) capsule Take 50,000 Units by mouth every Tuesday.    [provider]  losartan  (COZAAR ) 50 MG tablet Take 50 mg by mouth in the morning.    [provider]  Menthol , Topical Analgesic, (BENGAY EX) Apply 1 application. topically 3 (three) times daily as needed (leg/hip pain.).    [provider]  methocarbamol  (ROBAXIN ) 500 MG tablet Take 1 tablet (500 mg total) by mouth every 6 (six) hours as needed for muscle spasms. 10/02/21   Vernetta Lonni GRADE, MD  Multiple Vitamin (MULTIVITAMIN WITH MINERALS) TABS tablet Take 1 tablet by mouth in the morning. One A Day for Men 50+    [provider]  oxyCODONE -acetaminophen  (PERCOCET/ROXICET) 5-325 MG tablet Take 1-2 tablets by mouth every 6 (six) hours as needed for severe pain. 10/02/21   Vernetta Lonni GRADE, MD  rosuvastatin (CRESTOR) 10 MG tablet Take 10 mg by mouth at bedtime.    [provider]  tiZANidine  (ZANAFLEX ) 4 MG tablet Take 1 tablet (4 mg total) by mouth every 8 (eight) hours as needed for muscle spasms. 10/30/21   Vernetta Lonni GRADE, MD     Allergies: Patient has no known allergies.    Review of Systems  Updated Vital Signs BP (!) 157/83 (BP Location: Right Arm)   Pulse 61   Temp 97.7 F (36.5 C) (Oral)   Resp 18   Ht 5' 9 (1.753 m)   Wt 97.5 kg   SpO2 98%   BMI 31.75 kg/m   Physical Exam  (all labs ordered are listed, but only abnormal results are displayed) Labs Reviewed - No data to display  EKG: None  Radiology: DG Lumbar Spine Complete Result Date: 02/19/2024 CLINICAL DATA:  Lower back pain for 2 days EXAM: LUMBAR SPINE - COMPLETE 4+ VIEW COMPARISON:  None Available. FINDINGS: Frontal, bilateral oblique, lateral views of the lumbar spine are obtained on 5 images. There are 5 non-rib-bearing lumbar type vertebral bodies in normal anatomic alignment. No acute fractures. There is multilevel spondylosis and facet hypertrophy, greatest from L3-4 through L5-S1. The sacroiliac joints are unremarkable. Atherosclerosis of the aorta and its branches. Partial visualization of a left hip arthroplasty. IMPRESSION: 1. Lower lumbar spondylosis and facet hypertrophy. 2. No acute displaced fracture. Electronically Signed   By: Ozell Daring M.D.   On: 02/19/2024 12:18    {Document cardiac monitor, telemetry assessment procedure when appropriate:32947} Procedures   Medications Ordered in the ED  lidocaine  (LIDODERM ) 5 % 1 patch (1 patch Transdermal Patch Applied 02/19/24 1152)  naproxen (NAPROSYN) tablet 500 mg (500 mg Oral  Given 02/19/24 1153)  acetaminophen  (TYLENOL ) tablet 1,000 mg (1,000 mg Oral Given 02/19/24 1153)  methocarbamol  (ROBAXIN ) tablet 500 mg (500 mg Oral Given 02/19/24 1153)      {Click here for ABCD2, HEART and other calculators REFRESH Note before signing:1}                              Medical Decision Making Amount and/or Complexity of Data Reviewed Radiology: ordered.  Risk OTC drugs. Prescription drug management.   ***  {Document critical care time when appropriate  Document review of  labs and clinical decision tools ie CHADS2VASC2, etc  Document your independent review of radiology images and any outside records  Document your discussion with family members, caretakers and with consultants  Document social determinants of health affecting pt's care  Document your decision making why or why not admission, treatments were needed:32947:::1}   Final diagnoses:  None    ED Discharge Orders     None

## 2024-02-19 NOTE — ED Triage Notes (Signed)
 Pt ambulatory to triage. Pt endorses lower back pain that started two days ago. Reports that it started after mowing the yard.  Went to urgent care two days ago and received a shot in back, unsure what shot was.  Denies numbness/tingling in legs.  Denies any urinary of bowel symptoms.

## 2024-02-19 NOTE — Discharge Instructions (Addendum)
 We saw you in the ER for back pain. Fortunately, our evaluation is not concerning for emergent pathology such as spinal cord compression or infection.   Often the pain is self limiting, you just need time and supportive medications prescribed. Take the muscle relaxant as needed, and see your primary care doctor for further management if the symptoms continue to linger.   Please use the back exercises to strengthen the back muscles and be very careful with activities in future to prevent similar painful events.  Please return to the ER if your pain becomes excruciating or you start developing new numbness, weakness, urinary incontinence (peeing on self without warning), urinary retention (not able to pee despite bladder feeling full), inability to defecate, pins and needles sensation by your ano-genital area.

## 2024-02-21 ENCOUNTER — Encounter: Payer: Self-pay | Admitting: Radiology
